# Patient Record
Sex: Female | Born: 1995
Health system: Southern US, Community
[De-identification: ages and names within clinical notes are randomized; demographics above are authoritative.]

## PROBLEM LIST (undated history)

## (undated) DIAGNOSIS — R279 Unspecified lack of coordination: Secondary | ICD-10-CM

## (undated) HISTORY — PX: REFRACTIVE SURGERY: SHX103

## (undated) HISTORY — DX: Unspecified lack of coordination: R27.9

---

## 2014-03-03 ENCOUNTER — Other Ambulatory Visit: Payer: Self-pay | Admitting: Family Medicine

## 2014-03-03 DIAGNOSIS — R109 Unspecified abdominal pain: Secondary | ICD-10-CM

## 2014-03-03 DIAGNOSIS — R102 Pelvic and perineal pain: Secondary | ICD-10-CM

## 2014-03-06 ENCOUNTER — Ambulatory Visit
Admission: RE | Admit: 2014-03-06 | Discharge: 2014-03-06 | Disposition: A | Discharge status: Home / Self Care | Payer: 59 | Source: Ambulatory Visit | Attending: Family Medicine | Admitting: Family Medicine

## 2014-03-06 DIAGNOSIS — R102 Pelvic and perineal pain: Secondary | ICD-10-CM

## 2014-03-06 DIAGNOSIS — R109 Unspecified abdominal pain: Secondary | ICD-10-CM

## 2015-01-12 ENCOUNTER — Other Ambulatory Visit: Payer: Self-pay | Admitting: Occupational Medicine

## 2015-01-12 ENCOUNTER — Ambulatory Visit
Admission: RE | Admit: 2015-01-12 | Discharge: 2015-01-12 | Disposition: A | Discharge status: Home / Self Care | Payer: No Typology Code available for payment source | Source: Ambulatory Visit | Attending: Occupational Medicine | Admitting: Occupational Medicine

## 2015-01-12 DIAGNOSIS — Z021 Encounter for pre-employment examination: Secondary | ICD-10-CM

## 2015-03-01 HISTORY — PX: LASIK: SHX215

## 2015-05-07 ENCOUNTER — Ambulatory Visit
Admission: RE | Admit: 2015-05-07 | Discharge: 2015-05-07 | Disposition: A | Discharge status: Home / Self Care | Payer: Worker's Compensation | Source: Ambulatory Visit | Attending: Nurse Practitioner | Admitting: Nurse Practitioner

## 2015-05-07 ENCOUNTER — Other Ambulatory Visit: Payer: Self-pay | Admitting: Nurse Practitioner

## 2015-05-07 DIAGNOSIS — T1490XA Injury, unspecified, initial encounter: Secondary | ICD-10-CM

## 2015-05-07 DIAGNOSIS — R609 Edema, unspecified: Secondary | ICD-10-CM

## 2015-05-07 DIAGNOSIS — R52 Pain, unspecified: Secondary | ICD-10-CM

## 2018-04-16 ENCOUNTER — Encounter (HOSPITAL_COMMUNITY): Payer: Self-pay | Admitting: Emergency Medicine

## 2018-04-16 ENCOUNTER — Emergency Department (HOSPITAL_COMMUNITY)
Admission: EM | Admit: 2018-04-16 | Discharge: 2018-04-17 | Disposition: A | Discharge status: Home / Self Care | Payer: No Typology Code available for payment source | Attending: Emergency Medicine | Admitting: Emergency Medicine

## 2018-04-16 ENCOUNTER — Other Ambulatory Visit: Payer: Self-pay

## 2018-04-16 DIAGNOSIS — Z7721 Contact with and (suspected) exposure to potentially hazardous body fluids: Secondary | ICD-10-CM | POA: Diagnosis not present

## 2018-04-16 DIAGNOSIS — T148XXA Other injury of unspecified body region, initial encounter: Secondary | ICD-10-CM

## 2018-04-16 DIAGNOSIS — Z23 Encounter for immunization: Secondary | ICD-10-CM | POA: Diagnosis not present

## 2018-04-16 MED ORDER — TETANUS-DIPHTH-ACELL PERTUSSIS 5-2.5-18.5 LF-MCG/0.5 IM SUSP
0.5000 mL | Freq: Once | INTRAMUSCULAR | Status: AC
  Administered 2018-04-17: 0.5 mL via INTRAMUSCULAR
  Filled 2018-04-16: qty 0.5
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
## 2018-04-17 NOTE — Discharge Instructions (Signed)
Please follow up with resources provided on discharge paperwork.   Please return to the emergency department for any new or worsening symptoms.

## 2019-02-25 ENCOUNTER — Emergency Department (INDEPENDENT_AMBULATORY_CARE_PROVIDER_SITE_OTHER)
Admission: EM | Admit: 2019-02-25 | Discharge: 2019-02-25 | Disposition: A | Discharge status: Home / Self Care | Payer: 59 | Source: Home / Self Care | Attending: Family Medicine | Admitting: Family Medicine

## 2019-02-25 ENCOUNTER — Other Ambulatory Visit: Payer: Self-pay

## 2019-02-25 DIAGNOSIS — R2990 Unspecified symptoms and signs involving the nervous system: Secondary | ICD-10-CM

## 2019-02-25 NOTE — ED Triage Notes (Signed)
Pt c/o issues with her balance and coordination (deopth perception) x 3-4 days. Hx of ear infections but not currently experiencing any pain. Also wondering if it could be stress related.

## 2019-02-25 NOTE — Discharge Instructions (Addendum)
Please read attached information about multiple sclerosis. Please monitor your symptoms and record on a calendar.  Take this record to your neurologist.

## 2019-02-25 NOTE — ED Provider Notes (Signed)
[name]    CSN: [id number] Arrival date & time: 02/25/19  1230      History   Chief Complaint Chief Complaint  Patient presents with  . Balance Issues    HPI Julie Wagner is a 23 y.o. female.   Patient complains of 3 to 4 day history of feeling off-balance and decreased coordination.  She denies light-headedness and dizziness.  She has had ear infections in the past but has not had recent earache.  She wonders if her symptoms could be stress related.  The history is provided by the patient.    History reviewed. No pertinent past medical history.  There are no problems to display for this patient.   History reviewed. No pertinent surgical history.  OB History   No obstetric history on file.      Home Medications    Prior to Admission medications   Not on File    Family History History reviewed. No pertinent family history.  Social History Social History   Tobacco Use  . Smoking status: Never Smoker  . Smokeless tobacco: Never Used  Substance Use Topics  . Alcohol use: Never  . Drug use: Never     Allergies   Patient has no known allergies.   Review of Systems Review of Systems  Constitutional: Negative for activity change, appetite change, chills, diaphoresis, fatigue, fever and unexpected weight change.  HENT: Negative.   Eyes: Negative.   Respiratory: Negative.   Cardiovascular: Negative.   Gastrointestinal: Negative.   Endocrine: Negative.   Genitourinary: Negative.   Musculoskeletal: Negative.   Skin: Negative.   Neurological: Negative for dizziness, tremors, seizures, syncope, facial asymmetry, speech difficulty, weakness, light-headedness, numbness and headaches.  Psychiatric/Behavioral: The patient is not nervous/anxious.      Physical Exam Triage Vital Signs ED Triage Vitals [02/25/19 1300]  Enc Vitals Group     BP 137/86     Pulse Rate 89     Resp 18     Temp 98.4 F (36.9 C)     Temp Source Oral   SpO2 99 %     Weight 230 lb (104.3 kg)     Height 5\' 6"  (1.676 m)     Head Circumference      Peak Flow      Pain Score 0     Pain Loc      Pain Edu?      Excl. in [place]?    No data found.  Updated Vital Signs BP 137/86 (BP Location: Right Arm)   Pulse 89   Temp 98.4 F (36.9 C) (Oral)   Resp 18   Ht 5\' 6"  (1.676 m)   Wt 104.3 kg   LMP  (LMP Unknown)   SpO2 99%   BMI 37.12 kg/m   Visual Acuity Right Eye Distance:   Left Eye Distance:   Bilateral Distance:    Right Eye Near:   Left Eye Near:    Bilateral Near:     Physical Exam Nursing notes and Vital Signs reviewed. Appearance:  Patient appears stated age, and in no acute distress Eyes:  Pupils are equal, round, and reactive to light and accomodation.  Extraocular movement is intact.  Conjunctivae are not inflamed  Ears:  Canals normal.  Tympanic membranes normal.  Nose:   Normal turbinates.  No sinus tenderness.   Pharynx:  Normal Neck:  Supple.  No adenopathy or thyromegaly  Lungs:  Clear to auscultation.  Breath sounds are equal.  Moving air well. Heart:  Regular rate and rhythm without murmurs, rubs, or gallops.  Abdomen:  Nontender without masses or hepatosplenomegaly.  Bowel sounds are present.  No CVA or flank tenderness.  Extremities:  No edema.  Skin:  No rash present.  Neurologic:  Cranial nerves 2 through 12 are normal.  Patellar, achilles, and elbow reflexes are normal.  Cerebellar function is intact (finger-to-nose and rapid alternating hand movement).  Gait and station are normal.  Grip strength symmetric bilaterally.  Romberg negative.  UC Treatments / Results  Labs (all labs ordered are listed, but only abnormal results are displayed) Labs Reviewed - No data to display  EKG   Radiology No results found.  Procedures Procedures (including critical care time)  Medications Ordered in UC Medications - No data to display  Initial Impression / Assessment and Plan / UC Course  I have reviewed  the triage vital signs and the nursing notes.  Pertinent labs & imaging results that were available during my care of the patient were reviewed by me and considered in my medical decision making (see chart for details).    Normal neurologic exam reassuring.  ?early multiple sclerosis. Recommend follow-up with a neurologist.   Final Clinical Impressions(s) / UC Diagnoses   Final diagnoses:  Neurological complaint     Discharge Instructions     Please read attached information about multiple sclerosis. Please monitor your symptoms and record on a calendar.  Take this record to your neurologist.    ED Prescriptions    None        Lattie Haw, MD 03/01/19 [phone]

## 2019-02-26 ENCOUNTER — Encounter: Payer: Self-pay | Admitting: Neurology

## 2019-02-26 ENCOUNTER — Ambulatory Visit: Payer: 59 | Admitting: Neurology

## 2019-02-26 VITALS — BP 124/88 | HR 77 | Temp 97.4°F | Ht 66.0 in | Wt 247.0 lb

## 2019-02-26 DIAGNOSIS — R531 Weakness: Secondary | ICD-10-CM | POA: Insufficient documentation

## 2019-02-26 NOTE — Progress Notes (Signed)
PATIENT: Julie Wagner DOB: 03/05/1995  Chief Complaint  Patient presents with  . Coordination Issues    Reports one week ago, she started to feel uncoordinated and off balance.  Gives the example of missing the opening of her pants when stepping into them, not being able to pull her hair back and dropping things.  She feels like she has cotton mouth and her boyfriend told her she has been slurring words at times.  Says her left arm has been noticeably weaker than her right.  She was unable to lift a weight bar at the gym that is normally not a problem for her.   Marland Kitchen PCP    No established PCP.  Referral from Urgent Care.     HISTORICAL  Julie Wagner is a 24 year old female, seen in request by urgent care for evaluation of constellation of complaints, initial evaluation was on February 27, 2019.  I have reviewed and summarized the referring note from the referring physician.  She is a Loss adjuster, chartered, going to gym regularly, around February 23, 2019, she noticed difficulty involving her left arm, when she worked out at [employer], raising up weight, she noticed weakness in her left arm, gradually become more obvious, she drop things from her left arm, when she put her pants on in a standing position, she tends to miss the pant hole on the left side,  She denies numbness tingling, denies gait abnormality,   REVIEW OF SYSTEMS: Full 14 system review of systems performed and notable only for as above All other review of systems were negative.  ALLERGIES: No Known Allergies  HOME MEDICATIONS: No current outpatient medications on file.   No current facility-administered medications for this visit.    PAST MEDICAL HISTORY: Past Medical History:  Diagnosis Date  . Incoordination     PAST SURGICAL HISTORY: Past Surgical History:  Procedure Laterality Date  . REFRACTIVE SURGERY Bilateral     FAMILY HISTORY: Family History  Problem Relation Age of Onset  . Thyroid disease  Mother   . Diabetes Father   . Thyroid disease Brother     SOCIAL HISTORY: Social History   Socioeconomic History  . Marital status: Single    Spouse name: Not on file  . Number of children: 0  . Years of education: college  . Highest education level: Not on file  Occupational History  . Occupation: Engineer, structural  Tobacco Use  . Smoking status: Never Smoker  . Smokeless tobacco: Never Used  Substance and Sexual Activity  . Alcohol use: Never  . Drug use: Never  . Sexual activity: Not on file  Other Topics Concern  . Not on file  Social History Narrative   Lives at home with boyfriend.   Right-handed.   Drinks coffee, with extra espresso, in the mornings.  Occasionally uses caffeinated energy powder.   She works as a Engineer, structural in [place].   Social Determinants of Health   Financial Resource Strain:   . Difficulty of Paying Living Expenses: Not on file  Food Insecurity:   . Worried About Charity fundraiser in the Last Year: Not on file  . Ran Out of Food in the Last Year: Not on file  Transportation Needs:   . Lack of Transportation (Medical): Not on file  . Lack of Transportation (Non-Medical): Not on file  Physical Activity:   . Days of Exercise per Week: Not on file  . Minutes of Exercise per Session: Not  on file  Stress:   . Feeling of Stress : Not on file  Social Connections:   . Frequency of Communication with Friends and Family: Not on file  . Frequency of Social Gatherings with Friends and Family: Not on file  . Attends Religious Services: Not on file  . Active Member of Clubs or Organizations: Not on file  . Attends Banker Meetings: Not on file  . Marital Status: Not on file  Intimate Partner Violence:   . Fear of Current or Ex-Partner: Not on file  . Emotionally Abused: Not on file  . Physically Abused: Not on file  . Sexually Abused: Not on file     PHYSICAL EXAM   Vitals:   02/26/19 1504  BP: 124/88  Pulse: 77  Temp:  (!) 97.4 F (36.3 C)  Weight: 247 lb (112 kg)  Height: 5\' 6"  (1.676 m)    Not recorded      Body mass index is 39.87 kg/m.  PHYSICAL EXAMNIATION:  Gen: NAD, conversant, well nourised, well groomed                     Cardiovascular: Regular rate rhythm, no peripheral edema, warm, nontender. Eyes: Conjunctivae clear without exudates or hemorrhage Neck: Supple, no carotid bruits. Pulmonary: Clear to auscultation bilaterally   NEUROLOGICAL EXAM:  MENTAL STATUS: Speech:    Speech is normal; fluent and spontaneous with normal comprehension.  Cognition:     Orientation to time, place and person     Normal recent and remote memory     Normal Attention span and concentration     Normal Language, naming, repeating,spontaneous speech     Fund of knowledge   CRANIAL NERVES: CN II: Visual fields are full to confrontation. Pupils are round equal and briskly reactive to light. CN III, IV, VI: extraocular movement are normal. No ptosis. CN V: Facial sensation is intact to light touch CN VII: Face is symmetric with normal eye closure  CN VIII: Hearing is normal to causal conversation. CN IX, X: Phonation is normal. CN XI: Head turning and shoulder shrug are intact  MOTOR: She has mild left upper extremity pronation drift, mild left shoulder abduction, external rotation weakness.  REFLEXES: Reflexes are 2+ and symmetric at the biceps, triceps, knees, and ankles. Plantar responses are flexor.  SENSORY: Intact to light touch, pinprick and vibratory sensation are intact in fingers and toes.  COORDINATION: There is no trunk or limb dysmetria noted.  GAIT/STANCE: Posture is normal. Gait is steady with normal steps, base, arm swing, and turning. Heel and toe walking are normal. Tandem gait is normal.  Romberg is absent.   DIAGNOSTIC DATA (LABS, IMAGING, TESTING) - I reviewed patient records, labs, notes, testing and imaging myself where available.   ASSESSMENT AND PLAN  Julie Wagner is a 23 y.o. female   Left arm and subjective left leg weakness  Need to rule out right hemisphere pathology  Proceed with laboratory evaluations  MRI of brain with without contrast.   22, M.D. Ph.D.  [facility] [address], [address] [place], [place] Ph: [phone] Fax: [phone]  CC: Referring Provider

## 2019-02-27 ENCOUNTER — Telehealth: Payer: Self-pay | Admitting: *Deleted

## 2019-02-27 LAB — CBC WITH DIFFERENTIAL/PLATELET
Basophils Absolute: 0.1 10*3/uL (ref 0.0–0.2)
Basos: 0 %
EOS (ABSOLUTE): 0.1 10*3/uL (ref 0.0–0.4)
Eos: 1 %
Hematocrit: 41.9 % (ref 34.0–46.6)
Hemoglobin: 14.3 g/dL (ref 11.1–15.9)
Immature Grans (Abs): 0 10*3/uL (ref 0.0–0.1)
Immature Granulocytes: 0 %
Lymphocytes Absolute: 2.7 10*3/uL (ref 0.7–3.1)
Lymphs: 22 %
MCH: 30.1 pg (ref 26.6–33.0)
MCHC: 34.1 g/dL (ref 31.5–35.7)
MCV: 88 fL (ref 79–97)
Monocytes Absolute: 1.1 10*3/uL — ABNORMAL HIGH (ref 0.1–0.9)
Monocytes: 9 %
Neutrophils Absolute: 8.1 10*3/uL — ABNORMAL HIGH (ref 1.4–7.0)
Neutrophils: 68 %
Platelets: 323 10*3/uL (ref 150–450)
RBC: 4.75 x10E6/uL (ref 3.77–5.28)
RDW: 12 % (ref 11.7–15.4)
WBC: 12.1 10*3/uL — ABNORMAL HIGH (ref 3.4–10.8)

## 2019-02-27 LAB — COMPREHENSIVE METABOLIC PANEL
ALT: 10 IU/L (ref 0–32)
AST: 17 IU/L (ref 0–40)
Albumin/Globulin Ratio: 2 (ref 1.2–2.2)
Albumin: 4.9 g/dL (ref 3.9–5.0)
Alkaline Phosphatase: 75 IU/L (ref 39–117)
BUN/Creatinine Ratio: 16 (ref 9–23)
BUN: 13 mg/dL (ref 6–20)
Bilirubin Total: 0.3 mg/dL (ref 0.0–1.2)
CO2: 23 mmol/L (ref 20–29)
Calcium: 9.3 mg/dL (ref 8.7–10.2)
Chloride: 104 mmol/L (ref 96–106)
Creatinine, Ser: 0.79 mg/dL (ref 0.57–1.00)
GFR calc Af Amer: 120 mL/min/{1.73_m2} (ref >59)
GFR calc non Af Amer: 104 mL/min/{1.73_m2} (ref >59)
Globulin, Total: 2.5 g/dL (ref 1.5–4.5)
Glucose: 90 mg/dL (ref 65–99)
Potassium: 4.6 mmol/L (ref 3.5–5.2)
Sodium: 139 mmol/L (ref 134–144)
Total Protein: 7.4 g/dL (ref 6.0–8.5)

## 2019-02-27 LAB — VITAMIN B12: Vitamin B-12: 827 pg/mL (ref 232–1245)

## 2019-02-27 LAB — TSH: TSH: 0.624 u[IU]/mL (ref 0.450–4.500)

## 2019-02-27 LAB — C-REACTIVE PROTEIN: CRP: 7 mg/L (ref 0–10)

## 2019-02-27 LAB — RPR: RPR Ser Ql: NONREACTIVE

## 2019-02-27 LAB — SEDIMENTATION RATE: Sed Rate: 11 mm/hr (ref 0–32)

## 2019-02-27 LAB — CK: Total CK: 72 U/L (ref 32–182)

## 2019-02-27 NOTE — Telephone Encounter (Signed)
-----   Message from Marcial Pacas, MD sent at 02/27/2019  7:52 AM EST ----- There is mild elevated wbc on lab testing, with elevated neutrophils, ask her if she has any symptoms of infections, such as fever, UTI or URI?

## 2019-02-27 NOTE — Telephone Encounter (Signed)
I spoke to the patient.  She is not feeling bad now but says earlier this month she was bitten by a spider.  She was placed on an antibiotic then developed a yeast infection while taking it.  She was prescribed two medications for the yeast infection and it cleared up.  She understands that she should contact her PCP if any signs or symptoms of infection develop.

## 2019-03-06 ENCOUNTER — Ambulatory Visit: Payer: 59

## 2019-03-06 ENCOUNTER — Other Ambulatory Visit: Payer: Self-pay

## 2019-03-06 DIAGNOSIS — R531 Weakness: Secondary | ICD-10-CM | POA: Diagnosis not present

## 2019-03-06 MED ORDER — GADOBENATE DIMEGLUMINE 529 MG/ML IV SOLN
20.0000 mL | Freq: Once | INTRAVENOUS | Status: AC | PRN
  Administered 2019-03-06: 10:00:00 20 mL via INTRAVENOUS

## 2019-03-07 ENCOUNTER — Encounter: Payer: Self-pay | Admitting: Neurology

## 2019-03-07 ENCOUNTER — Ambulatory Visit: Payer: 59 | Admitting: Neurology

## 2019-03-07 ENCOUNTER — Telehealth: Payer: Self-pay | Admitting: Neurology

## 2019-03-07 ENCOUNTER — Other Ambulatory Visit: Payer: Self-pay

## 2019-03-07 VITALS — BP 123/73 | HR 78 | Temp 97.2°F | Ht 66.0 in | Wt 250.5 lb

## 2019-03-07 DIAGNOSIS — G35 Multiple sclerosis: Secondary | ICD-10-CM | POA: Diagnosis not present

## 2019-03-07 DIAGNOSIS — R531 Weakness: Secondary | ICD-10-CM | POA: Diagnosis not present

## 2019-03-07 MED ORDER — PREDNISONE 10 MG PO TABS: ORAL_TABLET | ORAL | 3 refills | Status: DC

## 2019-03-07 NOTE — Telephone Encounter (Signed)
I have spoken to the patient to let her know Dr. Terrace Arabia would like to discuss her MRI brain results.  She would like to come today.  She will arrive to the office at 3:45pm.

## 2019-03-07 NOTE — Progress Notes (Signed)
PATIENT: Julie Wagner DOB: Jun 07, 1995  Chief Complaint  Patient presents with  . Left-sided weakness    She is here to review the results of her MRI brain.     HISTORICAL  Julie Wagner is a 24 year old female, seen in request by urgent care for evaluation of constellation of complaints, initial evaluation was on February 27, 2019.  I have reviewed and summarized the referring note from the referring physician.  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  There are also 5 other enhancing lesions that is a smaller, and several other T2/flair foci that do not enhance, above findings most consistent with multiple sclerosis.  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, structural  Laboratory evaluation in December 2020 showed normal negative ESR, CMP, CPK, C-reactive protein, TSH, B12, RPR, there is evidence of mild elevated WBC 12.1, with elevated neutrophil 8.1,   REVIEW OF SYSTEMS: Full 14 system review of systems performed and notable only for as above All other review of systems were negative.  ALLERGIES: No Known Allergies  HOME MEDICATIONS: No current outpatient medications on file.   No current facility-administered medications for this visit.    PAST MEDICAL HISTORY: Past Medical History:    Diagnosis Date  . Incoordination     PAST SURGICAL HISTORY: Past Surgical History:  Procedure Laterality Date  . REFRACTIVE SURGERY Bilateral     FAMILY HISTORY: Family History  Problem Relation Age of Onset  . Thyroid disease Mother   . Diabetes Father   . Thyroid disease Brother     SOCIAL HISTORY: Social History   Socioeconomic History  . Marital status: Single    Spouse name: Not on file  . Number of children: 0  . Years of education: college  . Highest education level: Not on file  Occupational History  . Occupation: Engineer, structural  Tobacco Use  . Smoking status: Never Smoker  . Smokeless tobacco: Never Used  Substance and Sexual Activity  . Alcohol use: Never  . Drug use: Never  . Sexual activity: Not on file  Other Topics Concern  . Not on file  Social History Narrative   Lives at home with boyfriend.   Right-handed.   Drinks coffee, with extra espresso, in the mornings.  Occasionally uses caffeinated energy powder.   She works as a Engineer, structural in [place].   Social Determinants of Health   Financial Resource Strain:   . Difficulty of Paying Living Expenses: Not on file  Food Insecurity:   . Worried About Charity fundraiser in the Last Year: Not on file  . Ran Out of Food in the Last Year: Not on file  Transportation Needs:   . Lack of Transportation (Medical): Not on file  . Lack of Transportation (Non-Medical): Not on file  Physical Activity:   .  Days of Exercise per Week: Not on file  . Minutes of Exercise per Session: Not on file  Stress:   . Feeling of Stress : Not on file  Social Connections:   . Frequency of Communication with Friends and Family: Not on file  . Frequency of Social Gatherings with Friends and Family: Not on file  . Attends Religious Services: Not on file  . Active Member of Clubs or Organizations: Not on file  . Attends Archivist Meetings: Not on file  . Marital Status: Not on file  Intimate Partner  Violence:   . Fear of Current or Ex-Partner: Not on file  . Emotionally Abused: Not on file  . Physically Abused: Not on file  . Sexually Abused: Not on file     PHYSICAL EXAM   Vitals:   03/07/19 1532  BP: 123/73  Pulse: 78  Temp: (!) 97.2 F (36.2 C)  Weight: 250 lb 8 oz (113.6 kg)  Height: '5\' 6"'$  (1.676 m)    Not recorded      Body mass index is 40.43 kg/m.  PHYSICAL EXAMNIATION:  Gen: NAD, conversant, well nourised, well groomed                     Cardiovascular: Regular rate rhythm, no peripheral edema, warm, nontender. Eyes: Conjunctivae clear without exudates or hemorrhage Neck: Supple, no carotid bruits. Pulmonary: Clear to auscultation bilaterally   NEUROLOGICAL EXAM:  MENTAL STATUS: Speech:    Speech is normal; fluent and spontaneous with normal comprehension.  Cognition:     Orientation to time, place and person     Normal recent and remote memory     Normal Attention span and concentration     Normal Language, naming, repeating,spontaneous speech     Fund of knowledge   CRANIAL NERVES: CN II: Visual fields are full to confrontation. Pupils are round equal and briskly reactive to light. CN III, IV, VI: extraocular movement are normal. No ptosis. CN V: Facial sensation is intact to light touch CN VII: Face is symmetric with normal eye closure  CN VIII: Hearing is normal to causal conversation. CN IX, X: Phonation is normal. CN XI: Head turning and shoulder shrug are intact  MOTOR: She has mild left upper extremity pronation drift, mild fixation of left upper extremity upon rapid rotating movement  REFLEXES: Reflexes are 2+ and symmetric at the biceps, triceps, knees, and ankles. Plantar responses are flexor.  SENSORY: Intact to light touch, pinprick and vibratory sensation are intact in fingers and toes.  COORDINATION: There is no trunk or limb dysmetria noted.  GAIT/STANCE: Posture is normal. Gait is steady with normal steps, base, arm  swing, and turning. Heel and toe walking are normal. Tandem gait is normal.  Romberg is absent.   DIAGNOSTIC DATA (LABS, IMAGING, TESTING) - I reviewed patient records, labs, notes, testing and imaging myself where available.   ASSESSMENT AND PLAN  Julie Wagner is a 24 y.o. female   Relapsing remitting multiple sclerosis  There is confirmed by abnormal MRI of the brain with without contrast, large 1. 8x1.7, x2.0 cm heterogeneous ring-enhancing lesion at the right frontal lobe, and 5 other enhancing lesions,  Complete evaluation with MRI of cervical, thoracic spine with without contrast  Lumbar puncture, MS panel,  We also went over MS related information, suggested [facility], provide her treatment options, including Tysabri, ocrelizumab, Orlan Leavens, M.D. Ph.D.  [facility] [phone] [address]rd  [address], [place], [place] [address] Ph: [phone] Fax: [phone]  CC: Referring Provider

## 2019-03-07 NOTE — Telephone Encounter (Signed)
IMPRESSION: This MRI of the brain with and without contrast shows the following: 1.    There is a large heterogenous rim-enhancing lesion in the right frontal lobe associated with mass-effect.  There are also 5 other enhancing lesions that are smaller and several other T2/FLAIR hyperintense foci that do not enhance.  The combination of enhancing and nonenhancing lesions is consistent with a diagnosis of multiple sclerosis.  The large focus likely represents a tumefactive lesion while the other enhancing lesions are more typical for MS.   Please call patient, MRI of the brain showed a large right frontal lesions, and other 5 enhancing lesions, above findings consistent with diagnosis of multiple sclerosis,  Please give her a follow-up appointment today, or tomorrow morning to go over her MRIs and treatment plans.

## 2019-03-07 NOTE — Telephone Encounter (Signed)
Left message on patient's voicemail.  Also, left message on her mother's number (on DPR).  Requested a return call.

## 2019-03-07 NOTE — Patient Instructions (Signed)
[email or web address]  IV infusion Lemtrada (alemtuzumab) Ocrevus (ocrelizumab) Tysabri (natalizumab)  Pills Mavenclad (cladribine)

## 2019-03-08 ENCOUNTER — Other Ambulatory Visit: Payer: Self-pay

## 2019-03-08 ENCOUNTER — Telehealth: Payer: Self-pay | Admitting: Neurology

## 2019-03-08 ENCOUNTER — Other Ambulatory Visit (INDEPENDENT_AMBULATORY_CARE_PROVIDER_SITE_OTHER): Payer: Self-pay

## 2019-03-08 DIAGNOSIS — G35 Multiple sclerosis: Secondary | ICD-10-CM

## 2019-03-08 DIAGNOSIS — Z0289 Encounter for other administrative examinations: Secondary | ICD-10-CM

## 2019-03-08 LAB — ANA W/REFLEX: Anti Nuclear Antibody (ANA): NEGATIVE

## 2019-03-08 NOTE — Telephone Encounter (Signed)
I have called Julie Wagner, left message, was able to talk with her mother:  Laboratory evaluation continues to show elevated WBC, neutrophil, indicate potential infection  Mother reported she was treated for UTI a month ago, I ordered repeat UA, with urine culture, chest x-ray,  Advised her to not take prednisone prescription  Vitamin D level was 26, she should take over-the-counter vitamin D3 supplement 1000 units 2 tablets daily

## 2019-03-10 LAB — URINALYSIS, ROUTINE W REFLEX MICROSCOPIC
Bilirubin, UA: NEGATIVE
Glucose, UA: NEGATIVE
Ketones, UA: NEGATIVE
Leukocytes,UA: NEGATIVE
Nitrite, UA: NEGATIVE
Protein,UA: NEGATIVE
RBC, UA: NEGATIVE
Specific Gravity, UA: 1.025 (ref 1.005–1.030)
Urobilinogen, Ur: 0.2 mg/dL (ref 0.2–1.0)
pH, UA: 7 (ref 5.0–7.5)

## 2019-03-10 LAB — URINE CULTURE

## 2019-03-12 LAB — QUANTIFERON-TB GOLD PLUS
QuantiFERON Mitogen Value: >10 IU/mL
QuantiFERON Nil Value: 0.03 IU/mL
QuantiFERON TB1 Ag Value: 0.05 IU/mL
QuantiFERON TB2 Ag Value: 0.04 IU/mL
QuantiFERON-TB Gold Plus: NEGATIVE

## 2019-03-12 LAB — ANGIOTENSIN CONVERTING ENZYME: Angio Convert Enzyme: 29 U/L (ref 14–82)

## 2019-03-12 LAB — CBC WITH DIFFERENTIAL
Basophils Absolute: 0.1 10*3/uL (ref 0.0–0.2)
Basos: 0 %
EOS (ABSOLUTE): 0.1 10*3/uL (ref 0.0–0.4)
Eos: 1 %
Hematocrit: 43.6 % (ref 34.0–46.6)
Hemoglobin: 14.3 g/dL (ref 11.1–15.9)
Immature Grans (Abs): 0 10*3/uL (ref 0.0–0.1)
Immature Granulocytes: 0 %
Lymphocytes Absolute: 1.9 10*3/uL (ref 0.7–3.1)
Lymphs: 14 %
MCH: 30.3 pg (ref 26.6–33.0)
MCHC: 32.8 g/dL (ref 31.5–35.7)
MCV: 92 fL (ref 79–97)
Monocytes Absolute: 1.1 10*3/uL — ABNORMAL HIGH (ref 0.1–0.9)
Monocytes: 8 %
Neutrophils Absolute: 10.6 10*3/uL — ABNORMAL HIGH (ref 1.4–7.0)
Neutrophils: 77 %
RBC: 4.72 x10E6/uL (ref 3.77–5.28)
RDW: 12.1 % (ref 11.7–15.4)
WBC: 13.8 10*3/uL — ABNORMAL HIGH (ref 3.4–10.8)

## 2019-03-12 LAB — HEPATITIS PANEL, ACUTE
Hep A IgM: NEGATIVE
Hep B C IgM: NEGATIVE
Hep C Virus Ab: <0.1 s/co ratio (ref 0.0–0.9)
Hepatitis B Surface Ag: NEGATIVE

## 2019-03-12 LAB — VITAMIN D 25 HYDROXY (VIT D DEFICIENCY, FRACTURES): Vit D, 25-Hydroxy: 26.2 ng/mL — ABNORMAL LOW (ref 30.0–100.0)

## 2019-03-12 LAB — HEPATITIS B CORE ANTIBODY, TOTAL: Hep B Core Total Ab: NEGATIVE

## 2019-03-12 LAB — IMMUNOFIXATION ELECTROPHORESIS
IgA/Immunoglobulin A, Serum: 201 mg/dL (ref 87–352)
IgG (Immunoglobin G), Serum: 894 mg/dL (ref 586–1602)
IgM (Immunoglobulin M), Srm: 95 mg/dL (ref 26–217)
Total Protein: 7.1 g/dL (ref 6.0–8.5)

## 2019-03-12 LAB — VARICELLA ZOSTER ANTIBODY, IGG: Varicella zoster IgG: 1491 index (ref >165)

## 2019-03-12 LAB — B. BURGDORFI ANTIBODIES: Lyme IgG/IgM Ab: <0.91 {ISR} (ref 0.00–0.90)

## 2019-03-12 LAB — COPPER, SERUM: Copper: 130 ug/dL (ref 80–158)

## 2019-03-12 LAB — HIV ANTIBODY (ROUTINE TESTING W REFLEX): HIV Screen 4th Generation wRfx: NONREACTIVE

## 2019-03-12 LAB — FOLATE: Folate: 9 ng/mL (ref >3.0)

## 2019-03-13 ENCOUNTER — Ambulatory Visit
Admission: RE | Admit: 2019-03-13 | Discharge: 2019-03-13 | Disposition: A | Discharge status: Home / Self Care | Payer: 59 | Source: Ambulatory Visit | Attending: Neurology | Admitting: Neurology

## 2019-03-13 ENCOUNTER — Other Ambulatory Visit: Payer: Self-pay

## 2019-03-13 VITALS — BP 145/70 | HR 83

## 2019-03-13 DIAGNOSIS — R531 Weakness: Secondary | ICD-10-CM

## 2019-03-13 DIAGNOSIS — G35 Multiple sclerosis: Secondary | ICD-10-CM

## 2019-03-13 MED ORDER — DIAZEPAM 5 MG PO TABS
5.0000 mg | ORAL_TABLET | Freq: Once | ORAL | Status: AC
Start: 2019-03-13 — End: 2019-03-13
  Administered 2019-03-13: 5 mg via ORAL

## 2019-03-13 NOTE — Progress Notes (Signed)
Pt in nursing station, very tearful and anxious of procedure today. Dr. Karin Golden notified of pts anxiety. Md ordered Valium. See MAR.

## 2019-03-13 NOTE — Discharge Instructions (Signed)

## 2019-03-19 ENCOUNTER — Telehealth: Payer: Self-pay | Admitting: Neurology

## 2019-03-19 NOTE — Telephone Encounter (Signed)
I returned the call to the patient. She has been feeling anxious and frequently vomiting since her recent diagnosis of MS. She is going to contact her PCP to schedule an appt to address these symptoms. Also, to rule out any other causes for the vomiting. She has a pending appt with Dr. Terrace Arabia on 03/28/2019 and I encouraged her to write her questions down so they can all be answered. She was in agreement with this plan.

## 2019-03-19 NOTE — Telephone Encounter (Signed)
Patient called in and stated she has been vomiting a lot and she wants to know what can be done because she doesn't want to keep vomiting every day and all day

## 2019-03-25 ENCOUNTER — Telehealth: Payer: Self-pay | Admitting: Neurology

## 2019-03-25 NOTE — Telephone Encounter (Signed)
I returned the call to the patient and she verbalized understanding. She will keep her pending appt on 03/28/2019.

## 2019-03-25 NOTE — Telephone Encounter (Signed)
The patient is aware. She has a pending appt on 03/28/2019 to discuss her diagnosis and treatment options.

## 2019-03-25 NOTE — Telephone Encounter (Signed)
Pt was informed. Pt is wanting to speak to RN. Please advise.

## 2019-03-25 NOTE — Telephone Encounter (Signed)
Please call patient, spinal fluid testing also supported diagnosis of multiple sclerosis,

## 2019-03-27 ENCOUNTER — Ambulatory Visit: Payer: Self-pay | Admitting: Neurology

## 2019-03-28 ENCOUNTER — Other Ambulatory Visit: Payer: Self-pay

## 2019-03-28 ENCOUNTER — Telehealth: Payer: Self-pay | Admitting: *Deleted

## 2019-03-28 ENCOUNTER — Ambulatory Visit: Payer: 59 | Admitting: Neurology

## 2019-03-28 ENCOUNTER — Ambulatory Visit
Admission: RE | Admit: 2019-03-28 | Discharge: 2019-03-28 | Disposition: A | Discharge status: Home / Self Care | Payer: 59 | Source: Ambulatory Visit | Attending: Neurology | Admitting: Neurology

## 2019-03-28 ENCOUNTER — Encounter: Payer: Self-pay | Admitting: *Deleted

## 2019-03-28 ENCOUNTER — Telehealth: Payer: Self-pay | Admitting: Neurology

## 2019-03-28 ENCOUNTER — Encounter: Payer: Self-pay | Admitting: Neurology

## 2019-03-28 VITALS — BP 142/89 | HR 79 | Temp 97.3°F | Ht 66.0 in | Wt 247.0 lb

## 2019-03-28 DIAGNOSIS — G35 Multiple sclerosis: Secondary | ICD-10-CM

## 2019-03-28 DIAGNOSIS — F419 Anxiety disorder, unspecified: Secondary | ICD-10-CM

## 2019-03-28 MED ORDER — ONDANSETRON 4 MG PO TBDP: 4.0000 mg | ORAL_TABLET | Freq: Every day | ORAL | 6 refills | Status: DC | PRN

## 2019-03-28 MED ORDER — SERTRALINE HCL 50 MG PO TABS: 100.0000 mg | ORAL_TABLET | Freq: Every day | ORAL | 11 refills | Status: DC

## 2019-03-28 NOTE — Telephone Encounter (Signed)
JCV collected on 03/07/2019:  JCV 1.35 (H)

## 2019-03-28 NOTE — Progress Notes (Signed)
PATIENT: Julie Wagner DOB: 10/25/1995  Chief Complaint  Patient presents with  . Multiple Sclerosis    She is here with her significant other. She was provided with a Prednisone rx but never took it. She is wondering if she needs to start the medication. She would like to review her test results and discuss treatment for her diagnosis.  Her JCV collected on 03/07/2019 came back high at 1.35.     HISTORICAL  Julie Wagner is a 24 year old female, seen in request by urgent care for evaluation of constellation of complaints, initial evaluation was on February 27, 2019.  I have reviewed and summarized the referring note from the referring physician.  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  There are also 5 other enhancing lesions that is a smaller, and several other T2/flair foci that do not enhance, above findings most consistent with multiple sclerosis.  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, structural  Laboratory evaluation in December 2020 showed normal negative ESR, CMP, CPK, C-reactive protein, TSH, B12, RPR, there is evidence of mild elevated WBC 12.1, with elevated neutrophil 8.1,  Update March 28, 2019: She is accompanied by her significant others at today's clinical visit, we again reviewed MRI  of the brain with without contrast Spinal fluid testing on March 13, 2019 showed more than 5 oligoclonal band, elevated IgG synthetic rate, elevated IgG index, mildly low vitamin D 26,  Negative QuantiFERON-TB, positive VZV IgG titer, negative ANA, hepatitis C, Titus B core antibody, hepatitis B surface antigen,  Left-sided weakness has mostly recovered, only have residual right arm weakness,  She complains of anxiety, frequent nausea, difficulty sleeping since diagnosis of multiple sclerosis,  REVIEW OF SYSTEMS: Full 14 system review of systems performed and notable only for as above All other review of systems were negative.  ALLERGIES: No Known Allergies  HOME MEDICATIONS: Current Outpatient Medications  Medication Sig Dispense Refill  . predniSONE (DELTASONE) 10 MG tablet 6 tablets for 3 days 5 tablets for 3 days 4 tablets for 3 days 3 tablets for 3 days 2 tablets for 3 days 1 tablet for 3 days 90 tablet 3   No current facility-administered medications for this visit.    PAST MEDICAL HISTORY: Past Medical History:  Diagnosis Date  . Incoordination     PAST SURGICAL HISTORY: Past Surgical History:  Procedure Laterality Date  . REFRACTIVE SURGERY Bilateral     FAMILY HISTORY: Family History  Problem Relation Age of Onset  . Thyroid disease Mother   . Diabetes Father   . Thyroid disease Brother     SOCIAL HISTORY: Social History   Socioeconomic History  . Marital status: Single    Spouse name: Not on file  . Number of children: 0  . Years of education: college  . Highest  education level: Not on file  Occupational History  . Occupation: Engineer, structural  Tobacco Use  . Smoking status: Never Smoker  . Smokeless tobacco: Never Used  Substance and Sexual Activity  . Alcohol use: Never  . Drug use: Never  . Sexual activity: Not on file  Other Topics Concern  . Not on file  Social History Narrative   Lives at home with boyfriend.   Right-handed.    Drinks coffee, with extra espresso, in the mornings.  Occasionally uses caffeinated energy powder.   She works as a Engineer, structural in [place].   Social Determinants of Health   Financial Resource Strain:   . Difficulty of Paying Living Expenses: Not on file  Food Insecurity:   . Worried About Charity fundraiser in the Last Year: Not on file  . Ran Out of Food in the Last Year: Not on file  Transportation Needs:   . Lack of Transportation (Medical): Not on file  . Lack of Transportation (Non-Medical): Not on file  Physical Activity:   . Days of Exercise per Week: Not on file  . Minutes of Exercise per Session: Not on file  Stress:   . Feeling of Stress : Not on file  Social Connections:   . Frequency of Communication with Friends and Family: Not on file  . Frequency of Social Gatherings with Friends and Family: Not on file  . Attends Religious Services: Not on file  . Active Member of Clubs or Organizations: Not on file  . Attends Archivist Meetings: Not on file  . Marital Status: Not on file  Intimate Partner Violence:   . Fear of Current or Ex-Partner: Not on file  . Emotionally Abused: Not on file  . Physically Abused: Not on file  . Sexually Abused: Not on file     PHYSICAL EXAM   Vitals:   03/28/19 0916  BP: (!) 142/89  Pulse: 79  Temp: (!) 97.3 F (36.3 C)  Weight: 247 lb (112 kg)  Height: 5' 6"  (1.676 m)    Not recorded      Body mass index is 39.87 kg/m.  PHYSICAL EXAMNIATION:  Gen: NAD, conversant, well nourised, well groomed                     Cardiovascular: Regular rate rhythm, no peripheral edema, warm, nontender. Eyes: Conjunctivae clear without exudates or hemorrhage Neck: Wagner, no carotid bruits. Pulmonary: Clear to auscultation bilaterally   NEUROLOGICAL EXAM:  MENTAL STATUS: Speech:    Speech is normal; fluent and spontaneous with normal comprehension.  Cognition:     Orientation to time, place and person     Normal  recent and remote memory     Normal Attention span and concentration     Normal Language, naming, repeating,spontaneous speech     Fund of knowledge   CRANIAL NERVES: CN II: Visual fields are full to confrontation. Pupils are round equal and briskly reactive to light. CN III, IV, VI: extraocular movement are normal. No ptosis. CN V: Facial sensation is intact to light touch CN VII: Face is symmetric with normal eye closure  CN VIII: Hearing is normal to causal conversation. CN IX, X: Phonation is normal. CN XI: Head turning and shoulder shrug are intact  MOTOR: She has mild fixation of left upper extremity upon rapid rotating movement  REFLEXES: Reflexes are 2+ and symmetric at the biceps, triceps, knees, and ankles. Plantar responses are flexor.  SENSORY: Intact  to light touch, pinprick and vibratory sensation are intact in fingers and toes.  COORDINATION: There is no trunk or limb dysmetria noted.  GAIT/STANCE: Posture is normal. Gait is steady with normal steps, base, arm swing, and turning. Heel and toe walking are normal. Tandem gait is normal.  Romberg is absent.   DIAGNOSTIC DATA (LABS, IMAGING, TESTING) - I reviewed patient records, labs, notes, testing and imaging myself where available.   ASSESSMENT AND PLAN  Julie Wagner is a 24 y.o. female   Relapsing remitting multiple sclerosis  There is confirmed by abnormal MRI of the brain with without contrast, large 1. 8x1.7, x2.0 cm heterogeneous ring-enhancing lesion at the right frontal lobe, and 5 other enhancing lesions,  CSF showed more than 5 oligoclonal banding, elevated IgG synthesis rate, IgG index  Complete evaluation with MRI of cervical, thoracic spine with without contrast  We had extensive discussion about treatment options, decided to proceed with [facility], she understands the potential side effect,  Anxiety  Zoloft 50 mg titrating to 100 mg,  Zofran as needed for nausea  Elevated CBC  Repeat  laboratory evaluations  UA chest x-ray to rule out infection  Marcial Pacas, M.D. Ph.D.  [facility] [address], [place], [place] [address] Ph: [phone] Fax: [phone]  CC: Referring Provider

## 2019-03-28 NOTE — Telephone Encounter (Signed)
PA for Mavenclad started on covermymeds ([id number]). Pt has coverage with [employer] [phone]). Pt TG#[id number]. LG#[id number]. Decision pending.

## 2019-03-28 NOTE — Telephone Encounter (Signed)
I have ordered MRI cervical, thoracic w/wo, but she has not heard anything from it.

## 2019-03-29 LAB — PREGNANCY, URINE: Preg Test, Ur: NEGATIVE

## 2019-03-29 LAB — CBC WITH DIFFERENTIAL/PLATELET
Basophils Absolute: 0.1 10*3/uL (ref 0.0–0.2)
Basos: 1 %
EOS (ABSOLUTE): 0.2 10*3/uL (ref 0.0–0.4)
Eos: 2 %
Hematocrit: 41.1 % (ref 34.0–46.6)
Hemoglobin: 13.6 g/dL (ref 11.1–15.9)
Immature Grans (Abs): 0 10*3/uL (ref 0.0–0.1)
Immature Granulocytes: 0 %
Lymphocytes Absolute: 2.5 10*3/uL (ref 0.7–3.1)
Lymphs: 29 %
MCH: 30.6 pg (ref 26.6–33.0)
MCHC: 33.1 g/dL (ref 31.5–35.7)
MCV: 93 fL (ref 79–97)
Monocytes Absolute: 1 10*3/uL — ABNORMAL HIGH (ref 0.1–0.9)
Monocytes: 11 %
Neutrophils Absolute: 5.1 10*3/uL (ref 1.4–7.0)
Neutrophils: 57 %
Platelets: 300 10*3/uL (ref 150–450)
RBC: 4.44 x10E6/uL (ref 3.77–5.28)
RDW: 11.9 % (ref 11.7–15.4)
WBC: 8.8 10*3/uL (ref 3.4–10.8)

## 2019-03-29 LAB — URINALYSIS, ROUTINE W REFLEX MICROSCOPIC
Bilirubin, UA: NEGATIVE
Glucose, UA: NEGATIVE
Ketones, UA: NEGATIVE
Leukocytes,UA: NEGATIVE
Nitrite, UA: NEGATIVE
Protein,UA: NEGATIVE
RBC, UA: NEGATIVE
Specific Gravity, UA: 1.019 (ref 1.005–1.030)
Urobilinogen, Ur: 0.2 mg/dL (ref 0.2–1.0)
pH, UA: 7.5 (ref 5.0–7.5)

## 2019-03-29 NOTE — Telephone Encounter (Signed)
PA approved through 03/27/2020.

## 2019-04-01 NOTE — Telephone Encounter (Signed)
I called the patient and offered to give her the number to GI, she said she would do it herself. DWD

## 2019-04-03 ENCOUNTER — Telehealth: Payer: Self-pay | Admitting: *Deleted

## 2019-04-03 NOTE — Telephone Encounter (Signed)
Received status update from [facility] [phone]) with the following information:  The patient has scheduled delivery of Mavenclad and should receive the shipment on 04/04/2019.

## 2019-04-11 LAB — FUNGUS CULTURE W SMEAR
CULTURE:: NO GROWTH
MICRO NUMBER:: 10037500
SMEAR:: NONE SEEN
SPECIMEN QUALITY:: ADEQUATE

## 2019-04-11 LAB — CSF CELL COUNT WITH DIFFERENTIAL
RBC Count, CSF: 0 cells/uL
WBC, CSF: 3 cells/uL (ref 0–5)

## 2019-04-11 LAB — MULTIPLE SCLEROSIS PANEL 2
Albumin Serum: 4.8 g/dL (ref 3.5–5.2)
Albumin, CSF: 20.3 mg/dL (ref 8.0–42.0)
CNS-IgG Synthesis Rate: 14.1 mg/24 h — ABNORMAL HIGH (ref <=3.3)
IgG (Immunoglobin G), Serum: 932 mg/dL (ref 600–1640)
IgG Total CSF: 5.3 mg/dL (ref 0.8–7.7)
IgG-Index: 1.34 — ABNORMAL HIGH (ref <0.66)
Myelin Basic Protein: <2 mcg/L (ref 2.0–4.0)

## 2019-04-11 LAB — PROTEIN, CSF: Total Protein, CSF: 33 mg/dL (ref 15–45)

## 2019-04-11 LAB — GLUCOSE, CSF: Glucose, CSF: 58 mg/dL (ref 40–80)

## 2019-04-11 LAB — GRAM STAIN
GRAM STAIN:: NONE SEEN
MICRO NUMBER:: 10037499
SPECIMEN QUALITY:: ADEQUATE

## 2019-04-11 LAB — VDRL, CSF: VDRL Quant, CSF: NONREACTIVE

## 2019-04-12 ENCOUNTER — Ambulatory Visit
Admission: RE | Admit: 2019-04-12 | Discharge: 2019-04-12 | Disposition: A | Discharge status: Home / Self Care | Payer: 59 | Source: Ambulatory Visit | Attending: Neurology | Admitting: Neurology

## 2019-04-12 ENCOUNTER — Other Ambulatory Visit: Payer: Self-pay

## 2019-04-12 DIAGNOSIS — G35 Multiple sclerosis: Secondary | ICD-10-CM

## 2019-04-12 MED ORDER — GADOBENATE DIMEGLUMINE 529 MG/ML IV SOLN: 20.0000 mL | Freq: Once | INTRAVENOUS | Status: DC | PRN

## 2019-04-12 MED ORDER — GADOBENATE DIMEGLUMINE 529 MG/ML IV SOLN
20.0000 mL | Freq: Once | INTRAVENOUS | Status: AC | PRN
  Administered 2019-04-12: 14:00:00 20 mL via INTRAVENOUS

## 2019-07-09 ENCOUNTER — Encounter: Payer: Self-pay | Admitting: Neurology

## 2019-07-09 ENCOUNTER — Other Ambulatory Visit: Payer: Self-pay

## 2019-07-09 ENCOUNTER — Ambulatory Visit: Payer: 59 | Admitting: Neurology

## 2019-07-09 VITALS — BP 117/77 | HR 72 | Ht 66.5 in | Wt 249.0 lb

## 2019-07-09 DIAGNOSIS — IMO0002 Reserved for concepts with insufficient information to code with codable children: Secondary | ICD-10-CM

## 2019-07-09 DIAGNOSIS — G43709 Chronic migraine without aura, not intractable, without status migrainosus: Secondary | ICD-10-CM | POA: Diagnosis not present

## 2019-07-09 DIAGNOSIS — G35 Multiple sclerosis: Secondary | ICD-10-CM | POA: Diagnosis not present

## 2019-07-09 DIAGNOSIS — F419 Anxiety disorder, unspecified: Secondary | ICD-10-CM | POA: Diagnosis not present

## 2019-07-09 MED ORDER — SUMATRIPTAN SUCCINATE 50 MG PO TABS: 50.0000 mg | ORAL_TABLET | ORAL | 6 refills | Status: DC | PRN

## 2019-07-09 MED ORDER — SERTRALINE HCL 50 MG PO TABS: 100.0000 mg | ORAL_TABLET | Freq: Every day | ORAL | 4 refills | Status: DC

## 2019-07-09 NOTE — Progress Notes (Signed)
PATIENT: Julie Wagner DOB: August 19, 1995  Chief Complaint  Patient presents with  . Multiple Sclerosis    New room, alone. No longer on mavenclad. Denies new symptoms.     HISTORICAL  Julie Wagner is a 24 year old female, seen in request by urgent care for evaluation of constellation of complaints, initial evaluation was on February 27, 2019.  I have reviewed and summarized the referring note from the referring physician.  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  There are also 5 other enhancing lesions that is a smaller, and several other T2/flair foci that do not enhance, above findings most consistent with multiple sclerosis.  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, structural  Laboratory evaluation in December 2020 showed normal negative ESR, CMP, CPK, C-reactive protein, TSH, B12, RPR, there is evidence of mild elevated WBC 12.1, with elevated neutrophil 8.1,  Update March 28, 2019: She is accompanied by her significant others at today's clinical visit, we again reviewed MRI of the brain with without contrast Spinal fluid testing on March 13, 2019 showed more than 5 oligoclonal band, elevated IgG synthetic rate, elevated IgG index, mildly low vitamin D 26,  Negative QuantiFERON-TB, positive VZV  IgG titer, negative ANA, hepatitis C, Titus B core antibody, hepatitis B surface antigen,  Left-sided weakness has mostly recovered, only have residual right arm weakness,  She complains of anxiety, frequent nausea, difficulty sleeping since diagnosis of multiple sclerosis,  UPDATE Jul 09 2019: She finished her Smiley treatment in February, and the second dose 4 weeks later, she complains of nausea, headache during treatment, but much improved afterwards  She had a couple years history of intermittent headaches, retro-orbital area severe pounding headache with associated light, smell sensitivity, movement made it worse, sleep helps, she has tried over-the-counter medication with limited help, is usually last half to 1 day  Her anxiety is under much better control taking Zoloft 50 mg 2 tablets every day, she works shift work as a Engineer, structural, 4 PM to 3 AM, 4 days on, 4 days of, complains of difficulty sleeping, taking Benadryl as needed, denies significant fatigue, continue has mild left arm weakness, was not able to go back to her workout routine, mild left leg difficulty contributed to her recent left ankle injury  I again personally reviewed MRI of the brain with without contrast in January 2021, large heterogeneously ring-enhancing lesion in the right frontal, with associated mass-effect, 5 other enhancing lesions that are smaller, that does not enhance  MRI of thoracic, and cervical spine showed no evidence of intrinsic lesions  Repeat CBC prior to treatment showed normal WBC, absolute lymphocyte 2.5,  REVIEW OF SYSTEMS: Full 14 system review of systems performed and notable only for as above All other review of systems were negative.  ALLERGIES: No Known Allergies  HOME MEDICATIONS: Current Outpatient Medications  Medication Sig Dispense Refill  . sertraline (ZOLOFT) 50 MG tablet Take 2 tablets (100 mg total) by mouth daily. 60 tablet 11   No current facility-administered  medications for this visit.    PAST MEDICAL HISTORY: Past Medical History:  Diagnosis Date  . Incoordination     PAST SURGICAL HISTORY: Past Surgical History:  Procedure Laterality Date  . REFRACTIVE SURGERY Bilateral     FAMILY HISTORY: Family History  Problem Relation Age of Onset  . Thyroid disease Mother   . Diabetes Father   . Thyroid disease Brother     SOCIAL HISTORY: Social History   Socioeconomic History  . Marital status: Single    Spouse name: Not on file  . Number of children: 0  . Years of education: college  . Highest education level: Not on file  Occupational History  . Occupation: Engineer, structural  Tobacco Use  . Smoking status: Never Smoker  . Smokeless tobacco: Never Used  Substance and Sexual Activity  . Alcohol use: Never  . Drug use: Never  . Sexual activity: Not on file  Other Topics Concern  . Not on file  Social History Narrative   Lives at home with boyfriend.   Right-handed.   Drinks coffee, with extra espresso, in the mornings.  Occasionally uses caffeinated energy powder.   She works as a Engineer, structural in [place].   Social Determinants of Health   Financial Resource Strain:   . Difficulty of Paying Living Expenses:   Food Insecurity:   . Worried About Charity fundraiser in the Last Year:   . Arboriculturist in the Last Year:   Transportation Needs:   . Film/video editor (Medical):   Marland Kitchen Lack of Transportation (Non-Medical):   Physical Activity:   . Days of Exercise per Week:   . Minutes of Exercise per Session:   Stress:   . Feeling of Stress :   Social Connections:   . Frequency of Communication with Friends and Family:   . Frequency of Social Gatherings with Friends and Family:   . Attends Religious Services:   . Active Member of Clubs or Organizations:   . Attends Archivist Meetings:   Marland Kitchen Marital Status:   Intimate Partner Violence:   . Fear of Current or Ex-Partner:   . Emotionally Abused:   Marland Kitchen  Physically Abused:   . Sexually Abused:      PHYSICAL EXAM   Vitals:   07/09/19 1056  BP: 117/77  Pulse: 72  Weight: 249 lb (112.9 kg)  Height: 5' 6.5" (1.689 m)    Not recorded      Body mass index is 39.59 kg/m.  PHYSICAL EXAMNIATION:  Gen: NAD, conversant, well nourised, well groomed                     Cardiovascular: Regular rate rhythm, no peripheral edema, warm, nontender. Eyes: Conjunctivae clear without exudates or hemorrhage Neck: Supple, no carotid bruits. Pulmonary: Clear to auscultation bilaterally   NEUROLOGICAL EXAM:  MENTAL STATUS: Speech:    Speech is normal; fluent and spontaneous with normal comprehension.  Cognition:     Orientation to time, place and person     Normal recent and remote memory     Normal Attention span and concentration     Normal Language, naming, repeating,spontaneous speech     Fund of knowledge   CRANIAL NERVES: CN II: Visual fields are full  to confrontation. Pupils are round equal and briskly reactive to light. CN III, IV, VI: extraocular movement are normal. No ptosis. CN V: Facial sensation is intact to light touch CN VII: Face is symmetric with normal eye closure  CN VIII: Hearing is normal to causal conversation. CN IX, X: Phonation is normal. CN XI: Head turning and shoulder shrug are intact  MOTOR: She has mild fixation of left upper extremity upon rapid rotating movement  REFLEXES: Reflexes are 2+ and symmetric at the biceps, triceps, knees, and ankles. Plantar responses are flexor.  SENSORY: Intact to light touch, pinprick and vibratory sensation are intact in fingers and toes.  COORDINATION: There is no trunk or limb dysmetria noted.  GAIT/STANCE: Posture is normal. Gait is steady with normal steps, base, arm swing, and turning. Heel and toe walking are normal. Tandem gait is normal.  Romberg is absent.   DIAGNOSTIC DATA (LABS, IMAGING, TESTING) - I reviewed patient records, labs, notes, testing and  imaging myself where available.   ASSESSMENT AND PLAN  Julie Wagner is a 24 y.o. female   Relapsing remitting multiple sclerosis  There is confirmed by abnormal MRI of the brain with without contrast, large 1. 8x1.7, x2.0 cm heterogeneous ring-enhancing lesion at the right frontal lobe, and 5 other enhancing lesions,  CSF showed more than 5 oligoclonal banding, elevated IgG synthesis rate, IgG index  There was no involvement of spinal cord based on MRI of cervical, thoracic spine with without contrast, and history  She completed her first year of [place] treatment in February, overall tolerating the medication well,  Repeat laboratory evaluation including CMP, CBC with differentiation, TSH  Repeat MRI of the brain with without contrast  Anxiety  Improved with Zoloft 100 mg daily  Benadryl, melatonin as needed for sleep  Headache,  Has migraine features,  Not responding well to over-the-counter medication, Imitrex 50 mg as needed  Marcial Pacas, M.D. Ph.D.  [facility] [address], [place], [place] [address] Ph: [phone] Fax: [phone]  CC: Referring Provider

## 2019-07-10 ENCOUNTER — Other Ambulatory Visit: Payer: Self-pay

## 2019-07-10 ENCOUNTER — Telehealth: Payer: Self-pay | Admitting: Neurology

## 2019-07-10 ENCOUNTER — Other Ambulatory Visit (INDEPENDENT_AMBULATORY_CARE_PROVIDER_SITE_OTHER): Payer: Self-pay

## 2019-07-10 DIAGNOSIS — G35 Multiple sclerosis: Secondary | ICD-10-CM

## 2019-07-10 DIAGNOSIS — Z0289 Encounter for other administrative examinations: Secondary | ICD-10-CM

## 2019-07-10 LAB — CBC WITH DIFFERENTIAL
Basophils Absolute: 0.1 10*3/uL (ref 0.0–0.2)
Basos: 1 %
EOS (ABSOLUTE): 0.3 10*3/uL (ref 0.0–0.4)
Eos: 4 %
Hematocrit: 42.2 % (ref 34.0–46.6)
Hemoglobin: 14.1 g/dL (ref 11.1–15.9)
Immature Grans (Abs): 0 10*3/uL (ref 0.0–0.1)
Immature Granulocytes: 0 %
Lymphocytes Absolute: 0.9 10*3/uL (ref 0.7–3.1)
Lymphs: 10 %
MCH: 31.2 pg (ref 26.6–33.0)
MCHC: 33.4 g/dL (ref 31.5–35.7)
MCV: 93 fL (ref 79–97)
Monocytes Absolute: 1 10*3/uL — ABNORMAL HIGH (ref 0.1–0.9)
Monocytes: 11 %
Neutrophils Absolute: 6.8 10*3/uL (ref 1.4–7.0)
Neutrophils: 74 %
RBC: 4.52 x10E6/uL (ref 3.77–5.28)
RDW: 12.5 % (ref 11.7–15.4)
WBC: 9.1 10*3/uL (ref 3.4–10.8)

## 2019-07-10 LAB — TSH: TSH: 0.377 u[IU]/mL — ABNORMAL LOW (ref 0.450–4.500)

## 2019-07-10 LAB — COMPREHENSIVE METABOLIC PANEL
ALT: 10 IU/L (ref 0–32)
AST: 18 IU/L (ref 0–40)
Albumin/Globulin Ratio: 1.6 (ref 1.2–2.2)
Albumin: 4.3 g/dL (ref 3.9–5.0)
Alkaline Phosphatase: 76 IU/L (ref 39–117)
BUN/Creatinine Ratio: 11 (ref 9–23)
BUN: 8 mg/dL (ref 6–20)
Bilirubin Total: 0.3 mg/dL (ref 0.0–1.2)
CO2: 23 mmol/L (ref 20–29)
Calcium: 8.9 mg/dL (ref 8.7–10.2)
Chloride: 104 mmol/L (ref 96–106)
Creatinine, Ser: 0.72 mg/dL (ref 0.57–1.00)
GFR calc Af Amer: 135 mL/min/{1.73_m2} (ref >59)
GFR calc non Af Amer: 117 mL/min/{1.73_m2} (ref >59)
Globulin, Total: 2.7 g/dL (ref 1.5–4.5)
Glucose: 90 mg/dL (ref 65–99)
Potassium: 3.9 mmol/L (ref 3.5–5.2)
Sodium: 139 mmol/L (ref 134–144)
Total Protein: 7 g/dL (ref 6.0–8.5)

## 2019-07-10 NOTE — Telephone Encounter (Signed)
Please call patient, laboratory evaluation showed mildly decreased TSH, this could indicate elevated thyroid function, I will repeat thyroid functional tests, lab order was placed, she can come in for repeat laboratory evaluation without appointment  CBC also showed decreased lymphocyte compared to her baseline, which is expected for patient receiving Mavenclad treatment, it is 900xE3/UL, which is still within normal limit.

## 2019-07-10 NOTE — Telephone Encounter (Signed)
I called the pt and we were able to review her labs. Pt verbalized understanding and will plan to come back for repeat labs this afternoon.

## 2019-07-11 ENCOUNTER — Telehealth: Payer: Self-pay | Admitting: Neurology

## 2019-07-11 DIAGNOSIS — R7989 Other specified abnormal findings of blood chemistry: Secondary | ICD-10-CM

## 2019-07-11 LAB — THYROID PANEL WITH TSH
Free Thyroxine Index: 1.7 (ref 1.2–4.9)
T3 Uptake Ratio: 22 % — ABNORMAL LOW (ref 24–39)
T4, Total: 7.6 ug/dL (ref 4.5–12.0)
TSH: 0.356 u[IU]/mL — ABNORMAL LOW (ref 0.450–4.500)

## 2019-07-11 NOTE — Telephone Encounter (Signed)
I referred her to [facility]  endocrinologsit

## 2019-07-11 NOTE — Telephone Encounter (Signed)
I reached out to the pt and advised of lab results. She verbalized understanding and was agreeable to referral to the endocrinologist.. She is not established with a PCP at this time.

## 2019-07-11 NOTE — Telephone Encounter (Signed)
Please call patient, repeat thyroid functional test was still mildly abnormal, decrease the TSH, with decrease the T3 uptake ratios, free thyroxine index,  I did not find her primary care information in chart,  She needs to follow-up with her primary care physician, if she prefer, I may refer her to an endocrinologist,

## 2019-07-11 NOTE — Addendum Note (Signed)
Addended by: Levert Feinstein on: 07/11/2019 05:12 PM   Modules accepted: Orders

## 2019-08-28 ENCOUNTER — Other Ambulatory Visit: Payer: Self-pay

## 2019-08-28 ENCOUNTER — Ambulatory Visit (INDEPENDENT_AMBULATORY_CARE_PROVIDER_SITE_OTHER): Payer: 59 | Admitting: Endocrinology

## 2019-08-28 ENCOUNTER — Encounter: Payer: Self-pay | Admitting: Endocrinology

## 2019-08-28 DIAGNOSIS — E0590 Thyrotoxicosis, unspecified without thyrotoxic crisis or storm: Secondary | ICD-10-CM | POA: Insufficient documentation

## 2019-08-28 NOTE — Progress Notes (Addendum)
Subjective:    Patient ID: Julie Wagner, female    DOB: Feb 26, 1996, 26 y.o.   MRN: [id number]  HPI Pt is referred by Dr. Terrace Arabia, for hyperthyroidism.  Pt reports he was dx'ed with hyperthyroidism 6 weeks ago.  she has never been on therapy for this.  she has never had XRT to the anterior neck, or thyroid surgery.  she has never had thyroid imaging.  she does not consume kelp or any other non-prescribed thyroid medication.  she has never been on amiodarone.  Main symptom is weight gain.   Past Medical History:  Diagnosis Date  . Incoordination     Past Surgical History:  Procedure Laterality Date  . REFRACTIVE SURGERY Bilateral     Social History   Socioeconomic History  . Marital status: Single    Spouse name: Not on file  . Number of children: 0  . Years of education: college  . Highest education level: Not on file  Occupational History  . Occupation: Emergency planning/management officer  Tobacco Use  . Smoking status: Never Smoker  . Smokeless tobacco: Never Used  Vaping Use  . Vaping Use: Never used  Substance and Sexual Activity  . Alcohol use: Never  . Drug use: Never  . Sexual activity: Not on file  Other Topics Concern  . Not on file  Social History Narrative   Lives at home with boyfriend.   Right-handed.   Drinks coffee, with extra espresso, in the mornings.  Occasionally uses caffeinated energy powder.   She works as a Emergency planning/management officer in [place].   Social Determinants of Health   Financial Resource Strain:   . Difficulty of Paying Living Expenses:   Food Insecurity:   . Worried About Programme researcher, broadcasting/film/video in the Last Year:   . Barista in the Last Year:   Transportation Needs:   . Freight forwarder (Medical):   Marland Kitchen Lack of Transportation (Non-Medical):   Physical Activity:   . Days of Exercise per Week:   . Minutes of Exercise per Session:   Stress:   . Feeling of Stress :   Social Connections:   . Frequency of Communication with Friends and Family:   .  Frequency of Social Gatherings with Friends and Family:   . Attends Religious Services:   . Active Member of Clubs or Organizations:   . Attends Banker Meetings:   Marland Kitchen Marital Status:   Intimate Partner Violence:   . Fear of Current or Ex-Partner:   . Emotionally Abused:   Marland Kitchen Physically Abused:   . Sexually Abused:     Current Outpatient Medications on File Prior to Visit  Medication Sig Dispense Refill  . cetirizine (ZYRTEC) 10 MG tablet Take 10 mg by mouth as needed for allergies.    Marland Kitchen ondansetron (ZOFRAN) 4 MG tablet Take 4 mg by mouth every 8 (eight) hours as needed for nausea or vomiting.     No current facility-administered medications on file prior to visit.    No Known Allergies  Family History  Problem Relation Age of Onset  . Thyroid disease Mother   . Diabetes Father   . Thyroid disease Brother     BP 138/80   Pulse 80   Ht 5' 6.5" (1.689 m)   Wt 254 lb 9.6 oz (115.5 kg)   SpO2 100%   BMI 40.48 kg/m     Review of Systems denies palpitations, sob, tremor, and heat intolerance.  She has  tingling on the left side of the body.  She has anxiety and night sweats.  Muscle strength is improved.       Objective:   Physical Exam VS: see vs page GEN: no distress HEAD: head: no deformity eyes: no periorbital swelling, no proptosis external nose and ears are normal NECK: supple, thyroid is not enlarged CHEST WALL: no deformity LUNGS: clear to auscultation CV: reg rate and rhythm, no murmur.  MUSCULOSKELETAL: muscle bulk and strength are grossly normal.  no obvious joint swelling.  gait is normal and steady.   EXTEMITIES: no deformity.  no edema PULSES: no carotid bruit NEURO:  cn 2-12 grossly intact.   readily moves all 4's.  sensation is intact to touch on all 4's SKIN:  Normal texture and temperature.  No rash or suspicious lesion is visible.   NODES:  None palpable at the neck PSYCH: alert, well-oriented.  Does not appear anxious nor depressed.     I have reviewed outside records, and summarized: Pt was noted to have suppressed TSH, and referred here.  She was seen for MS. TFT was checked to look for other cause of sxs.    Lab Results  Component Value Date   TSH 0.356 (L) 07/10/2019   T4TOTAL 7.6 07/10/2019   Lab Results  Component Value Date   TSH 0.84 08/28/2019   T4TOTAL 7.6 07/10/2019      Assessment & Plan:  Hyperthyroidism, mild.  This is usually due to MNG.  Normal on recheck.  I told pt hyperthyroidism will recur with time.    Patient Instructions  Blood tests are requested for you today.  We'll let you know about the results.  If it is high again, I'll prescribe for you a pill to slow it down.   If it is normal, we should follow it, as it could become overactive again.   Please come back for a follow-up appointment in 2 months.      Hyperthyroidism  Hyperthyroidism is when the thyroid gland is too active (overactive). The thyroid gland is a small gland located in the lower front part of the neck, just in front of the windpipe (trachea). This gland makes hormones that help control how the body uses food for energy (metabolism) as well as how the heart and brain function. These hormones also play a role in keeping your bones strong. When the thyroid is overactive, it produces too much of a hormone called thyroxine. What are the causes? This condition may be caused by:  Graves' disease. This is a disorder in which the body's disease-fighting system (immune system) attacks the thyroid gland. This is the most common cause.  Inflammation of the thyroid gland.  A tumor in the thyroid gland.  Use of certain medicines, including: ? Prescription thyroid hormone replacement. ? Herbal supplements that mimic thyroid hormones. ? Amiodarone therapy.  Solid or fluid-filled lumps within your thyroid gland (thyroid nodules).  Taking in a large amount of iodine from foods or medicines. What increases the risk? You  are more likely to develop this condition if:  You are female.  You have a family history of thyroid conditions.  You smoke tobacco.  You use a medicine called lithium.  You take medicines that affect the immune system (immunosuppressants). What are the signs or symptoms? Symptoms of this condition include:  Nervousness.  Inability to tolerate heat.  Unexplained weight loss.  Diarrhea.  Change in the texture of hair or skin.  Heart skipping beats or making  extra beats.  Rapid heart rate.  Loss of menstruation.  Shaky hands.  Fatigue.  Restlessness.  Sleep problems.  Enlarged thyroid gland or a lump in the thyroid (nodule). You may also have symptoms of Graves' disease, which may include:  Protruding eyes.  Dry eyes.  Red or swollen eyes.  Problems with vision. How is this diagnosed? This condition may be diagnosed based on:  Your symptoms and medical history.  A physical exam.  Blood tests.  Thyroid ultrasound. This test involves using sound waves to produce images of the thyroid gland.  A thyroid scan. A radioactive substance is injected into a vein, and images show how much iodine is present in the thyroid.  Radioactive iodine uptake test (RAIU). A small amount of radioactive iodine is given by mouth to see how much iodine the thyroid absorbs after a certain amount of time. How is this treated? Treatment depends on the cause and severity of the condition. Treatment may include:  Medicines to reduce the amount of thyroid hormone your body makes.  Radioactive iodine treatment (radioiodine therapy). This involves swallowing a small dose of radioactive iodine, in capsule or liquid form, to kill thyroid cells.  Surgery to remove part or all of your thyroid gland. You may need to take thyroid hormone replacement medicine for the rest of your life after thyroid surgery.  Medicines to help manage your symptoms. Follow these instructions at  home:   Take over-the-counter and prescription medicines only as told by your health care provider.  Do not use any products that contain nicotine or tobacco, such as cigarettes and e-cigarettes. If you need help quitting, ask your health care provider.  Follow any instructions from your health care provider about diet. You may be instructed to limit foods that contain iodine.  Keep all follow-up visits as told by your health care provider. This is important. ? You will need to have blood tests regularly so that your health care provider can monitor your condition. Contact a health care provider if:  Your symptoms do not get better with treatment.  You have a fever.  You are taking thyroid hormone replacement medicine and you: ? Have symptoms of depression. ? Feel like you are tired all the time. ? Gain weight. Get help right away if:  You have chest pain.  You have decreased alertness or a change in your awareness.  You have abdominal pain.  You feel dizzy.  You have a rapid heartbeat.  You have an irregular heartbeat.  You have difficulty breathing. Summary  The thyroid gland is a small gland located in the lower front part of the neck, just in front of the windpipe (trachea).  Hyperthyroidism is when the thyroid gland is too active (overactive) and produces too much of a hormone called thyroxine.  The most common cause is Graves' disease, a disorder in which your immune system attacks the thyroid gland.  Hyperthyroidism can cause various symptoms, such as unexplained weight loss, nervousness, inability to tolerate heat, or changes in your heartbeat.  Treatment may include medicine to reduce the amount of thyroid hormone your body makes, radioiodine therapy, surgery, or medicines to manage symptoms. This information is not intended to replace advice given to you by your health care provider. Make sure you discuss any questions you have with your health care  provider. Document Revised: 01/27/2017 Document Reviewed: 01/25/2017 Elsevier Patient Education  2020 [employer].

## 2019-08-28 NOTE — Patient Instructions (Addendum)
Blood tests are requested for you today.  We'll let you know about the results.  If it is high again, I'll prescribe for you a pill to slow it down.   If it is normal, we should follow it, as it could become overactive again.   Please come back for a follow-up appointment in 2 months.      Hyperthyroidism  Hyperthyroidism is when the thyroid gland is too active (overactive). The thyroid gland is a small gland located in the lower front part of the neck, just in front of the windpipe (trachea). This gland makes hormones that help control how the body uses food for energy (metabolism) as well as how the heart and brain function. These hormones also play a role in keeping your bones strong. When the thyroid is overactive, it produces too much of a hormone called thyroxine. What are the causes? This condition may be caused by:  Graves' disease. This is a disorder in which the body's disease-fighting system (immune system) attacks the thyroid gland. This is the most common cause.  Inflammation of the thyroid gland.  A tumor in the thyroid gland.  Use of certain medicines, including: ? Prescription thyroid hormone replacement. ? Herbal supplements that mimic thyroid hormones. ? Amiodarone therapy.  Solid or fluid-filled lumps within your thyroid gland (thyroid nodules).  Taking in a large amount of iodine from foods or medicines. What increases the risk? You are more likely to develop this condition if:  You are female.  You have a family history of thyroid conditions.  You smoke tobacco.  You use a medicine called lithium.  You take medicines that affect the immune system (immunosuppressants). What are the signs or symptoms? Symptoms of this condition include:  Nervousness.  Inability to tolerate heat.  Unexplained weight loss.  Diarrhea.  Change in the texture of hair or skin.  Heart skipping beats or making extra beats.  Rapid heart rate.  Loss of  menstruation.  Shaky hands.  Fatigue.  Restlessness.  Sleep problems.  Enlarged thyroid gland or a lump in the thyroid (nodule). You may also have symptoms of Graves' disease, which may include:  Protruding eyes.  Dry eyes.  Red or swollen eyes.  Problems with vision. How is this diagnosed? This condition may be diagnosed based on:  Your symptoms and medical history.  A physical exam.  Blood tests.  Thyroid ultrasound. This test involves using sound waves to produce images of the thyroid gland.  A thyroid scan. A radioactive substance is injected into a vein, and images show how much iodine is present in the thyroid.  Radioactive iodine uptake test (RAIU). A small amount of radioactive iodine is given by mouth to see how much iodine the thyroid absorbs after a certain amount of time. How is this treated? Treatment depends on the cause and severity of the condition. Treatment may include:  Medicines to reduce the amount of thyroid hormone your body makes.  Radioactive iodine treatment (radioiodine therapy). This involves swallowing a small dose of radioactive iodine, in capsule or liquid form, to kill thyroid cells.  Surgery to remove part or all of your thyroid gland. You may need to take thyroid hormone replacement medicine for the rest of your life after thyroid surgery.  Medicines to help manage your symptoms. Follow these instructions at home:   Take over-the-counter and prescription medicines only as told by your health care provider.  Do not use any products that contain nicotine or tobacco, such as cigarettes  and e-cigarettes. If you need help quitting, ask your health care provider.  Follow any instructions from your health care provider about diet. You may be instructed to limit foods that contain iodine.  Keep all follow-up visits as told by your health care provider. This is important. ? You will need to have blood tests regularly so that your health  care provider can monitor your condition. Contact a health care provider if:  Your symptoms do not get better with treatment.  You have a fever.  You are taking thyroid hormone replacement medicine and you: ? Have symptoms of depression. ? Feel like you are tired all the time. ? Gain weight. Get help right away if:  You have chest pain.  You have decreased alertness or a change in your awareness.  You have abdominal pain.  You feel dizzy.  You have a rapid heartbeat.  You have an irregular heartbeat.  You have difficulty breathing. Summary  The thyroid gland is a small gland located in the lower front part of the neck, just in front of the windpipe (trachea).  Hyperthyroidism is when the thyroid gland is too active (overactive) and produces too much of a hormone called thyroxine.  The most common cause is Graves' disease, a disorder in which your immune system attacks the thyroid gland.  Hyperthyroidism can cause various symptoms, such as unexplained weight loss, nervousness, inability to tolerate heat, or changes in your heartbeat.  Treatment may include medicine to reduce the amount of thyroid hormone your body makes, radioiodine therapy, surgery, or medicines to manage symptoms. This information is not intended to replace advice given to you by your health care provider. Make sure you discuss any questions you have with your health care provider. Document Revised: 01/27/2017 Document Reviewed: 01/25/2017 Elsevier Patient Education  2020 [employer].

## 2019-08-29 LAB — TSH: TSH: 0.84 u[IU]/mL (ref 0.35–4.50)

## 2019-08-29 LAB — T4, FREE: Free T4: 1.05 ng/dL (ref 0.60–1.60)

## 2019-10-23 ENCOUNTER — Telehealth: Payer: Self-pay | Admitting: Neurology

## 2019-10-23 NOTE — Telephone Encounter (Signed)
Patient is requesting to switch her care from Dr. Terrace Arabia to Dr. Anne Hahn. Patient was last seen by Dr. Terrace Arabia for MS. Please advise if this switch is acceptable

## 2019-10-24 NOTE — Telephone Encounter (Signed)
Ok for switch, but given fact I will be retiring soon, may be best to consider sater.

## 2019-10-29 ENCOUNTER — Telehealth: Payer: Self-pay | Admitting: Neurology

## 2019-10-29 NOTE — Telephone Encounter (Signed)
It is ok for her to see DR. Anne Hahn

## 2019-10-29 NOTE — Telephone Encounter (Signed)
Spoke with patients mother, Misty Stanley, regarding requested provider switch and advised her that both Dr. Terrace Arabia and Dr. Anne Hahn approved same. Patient is going to be a patient of Dr. Anne Hahn.

## 2019-10-29 NOTE — Telephone Encounter (Signed)
Spoke with patients

## 2019-10-30 ENCOUNTER — Other Ambulatory Visit: Payer: Self-pay

## 2019-10-30 ENCOUNTER — Ambulatory Visit (INDEPENDENT_AMBULATORY_CARE_PROVIDER_SITE_OTHER): Payer: 59 | Admitting: Endocrinology

## 2019-10-30 ENCOUNTER — Encounter: Payer: Self-pay | Admitting: Endocrinology

## 2019-10-30 VITALS — BP 124/78 | HR 75 | Ht 66.5 in | Wt 248.6 lb

## 2019-10-30 DIAGNOSIS — E0590 Thyrotoxicosis, unspecified without thyrotoxic crisis or storm: Secondary | ICD-10-CM

## 2019-10-30 LAB — TSH: TSH: 0.38 u[IU]/mL (ref 0.35–4.50)

## 2019-10-30 LAB — T4, FREE: Free T4: 0.86 ng/dL (ref 0.60–1.60)

## 2019-10-30 NOTE — Patient Instructions (Addendum)
Blood tests are requested for you today.  We'll let you know about the results.  If it is high again, I'll prescribe for you a pill to slow it down.   If it is normal, you should have it rechecked every 4-6 months, as it could become abnormal again.

## 2019-10-30 NOTE — Progress Notes (Signed)
Subjective:    Patient ID: Julie Wagner, female    DOB: [birth year], 24 y.o.   MRN: [id number]  HPI Pt returns for f/u of hyperthyroidism (dx'ed 2021; she has never been on therapy for this, as f/u TFT have been normal; she has never had thyroid imaging).  pt states she feels well in general.   Past Medical History:  Diagnosis Date  . Incoordination     Past Surgical History:  Procedure Laterality Date  . REFRACTIVE SURGERY Bilateral     Social History   Socioeconomic History  . Marital status: Single    Spouse name: Not on file  . Number of children: 0  . Years of education: college  . Highest education level: Not on file  Occupational History  . Occupation: Emergency planning/management officer  Tobacco Use  . Smoking status: Never Smoker  . Smokeless tobacco: Never Used  Vaping Use  . Vaping Use: Never used  Substance and Sexual Activity  . Alcohol use: Never  . Drug use: Never  . Sexual activity: Not on file  Other Topics Concern  . Not on file  Social History Narrative   Lives at home with boyfriend.   Right-handed.   Drinks coffee, with extra espresso, in the mornings.  Occasionally uses caffeinated energy powder.   She works as a Emergency planning/management officer in [place].   Social Determinants of Health   Financial Resource Strain:   . Difficulty of Paying Living Expenses: Not on file  Food Insecurity:   . Worried About Programme researcher, broadcasting/film/video in the Last Year: Not on file  . Ran Out of Food in the Last Year: Not on file  Transportation Needs:   . Lack of Transportation (Medical): Not on file  . Lack of Transportation (Non-Medical): Not on file  Physical Activity:   . Days of Exercise per Week: Not on file  . Minutes of Exercise per Session: Not on file  Stress:   . Feeling of Stress : Not on file  Social Connections:   . Frequency of Communication with Friends and Family: Not on file  . Frequency of Social Gatherings with Friends and Family: Not on file  . Attends Religious Services:  Not on file  . Active Member of Clubs or Organizations: Not on file  . Attends Banker Meetings: Not on file  . Marital Status: Not on file  Intimate Partner Violence:   . Fear of Current or Ex-Partner: Not on file  . Emotionally Abused: Not on file  . Physically Abused: Not on file  . Sexually Abused: Not on file    Current Outpatient Medications on File Prior to Visit  Medication Sig Dispense Refill  . cetirizine (ZYRTEC) 10 MG tablet Take 10 mg by mouth as needed for allergies.    Marland Kitchen ondansetron (ZOFRAN) 4 MG tablet Take 4 mg by mouth every 8 (eight) hours as needed for nausea or vomiting.     No current facility-administered medications on file prior to visit.    No Known Allergies  Family History  Problem Relation Age of Onset  . Thyroid disease Mother   . Diabetes Father   . Thyroid disease Brother     BP 124/78   Pulse 75   Ht 5' 6.5" (1.689 m)   Wt 248 lb 9.6 oz (112.8 kg)   SpO2 98%   BMI 39.52 kg/m    Review of Systems Denies neck swelling and pain.     Objective:  Physical Exam VITAL SIGNS:  See vs page GENERAL: no distress NECK: There is no palpable thyroid enlargement.  No thyroid nodule is palpable.  No palpable lymphadenopathy at the anterior neck.   Lab Results  Component Value Date   TSH 0.38 10/30/2019   T4TOTAL 7.6 07/10/2019       Assessment & Plan:  Hyperthyroidism: stable, but she is at risk for recurrence.  No medication is needed  Patient Instructions  Blood tests are requested for you today.  We'll let you know about the results.  If it is high again, I'll prescribe for you a pill to slow it down.   If it is normal, you should have it rechecked every 4-6 months, as it could become abnormal again.

## 2020-02-10 ENCOUNTER — Telehealth: Payer: Self-pay | Admitting: Neurology

## 2020-02-10 NOTE — Telephone Encounter (Signed)
Pt. wants to speak with RN regarding MS medication. Please advise.

## 2020-02-10 NOTE — Telephone Encounter (Signed)
I spoke to the patient. She would like to see Dr. Despina Arias and establish care for her MS. She is currently on [employer] and due for her second year in January 2022.  Per vo by Dr. Epimenio Foot, he will be glad to see her. She has been schedule with him on 02/12/20 at 1:30pm (check-in time 1pm).  I called [facility] [phone]) and spoke to [place]. She updated the patient's profile with the new provider information.

## 2020-02-12 ENCOUNTER — Ambulatory Visit (INDEPENDENT_AMBULATORY_CARE_PROVIDER_SITE_OTHER): Payer: 59 | Admitting: Neurology

## 2020-02-12 ENCOUNTER — Telehealth: Payer: Self-pay | Admitting: *Deleted

## 2020-02-12 ENCOUNTER — Encounter: Payer: Self-pay | Admitting: Neurology

## 2020-02-12 VITALS — BP 139/89 | HR 80 | Ht 66.5 in | Wt 255.2 lb

## 2020-02-12 DIAGNOSIS — Z79899 Other long term (current) drug therapy: Secondary | ICD-10-CM | POA: Diagnosis not present

## 2020-02-12 DIAGNOSIS — G35 Multiple sclerosis: Secondary | ICD-10-CM | POA: Diagnosis not present

## 2020-02-12 DIAGNOSIS — G43009 Migraine without aura, not intractable, without status migrainosus: Secondary | ICD-10-CM

## 2020-02-12 DIAGNOSIS — F419 Anxiety disorder, unspecified: Secondary | ICD-10-CM

## 2020-02-12 DIAGNOSIS — R531 Weakness: Secondary | ICD-10-CM

## 2020-02-12 MED ORDER — RIZATRIPTAN BENZOATE 10 MG PO TBDP: 10.0000 mg | ORAL_TABLET | ORAL | 11 refills | Status: DC | PRN

## 2020-02-12 NOTE — Telephone Encounter (Addendum)
Faxed completed/signed [employer] start form for year 2 to [facility] at [phone]. Received fax confirmation.  Submitted PA on CMM. Key: [id number] - PA Case ID: [id number]. Marked urgent. Waiting on determination from [facility].   PA approved until 04/14/2020. Request Reference Number: [id number].

## 2020-02-12 NOTE — Progress Notes (Signed)
[facility]  PATIENT: Julie Wagner DOB: 1995/04/13  REFERRING DOCTOR OR PCP: Levert Feinstein MD SOURCE: Patient, notes from Dr. Kennedy Bucker, lab reports, imaging results, MRI images personally reviewed.  _________________________________   HISTORICAL  CHIEF COMPLAINT:  Chief Complaint  Patient presents with  . Consult    RM 13. Transfer of care from Dr. Terrace Arabia. Patient currently on Mavenclad for MS.  Took year one month 1, 03/2019. Took month 2, 04/2019. Due for 2nd year. Would like to proceed if cleared for therapy. Denies any vision problems.    HISTORY OF PRESENT ILLNESS:  I had the pleasure seeing your patient, Julie Wagner, at the MS center [facility] for neurologic consultation regarding her multiple sclerosis.  She is a 24 year old woman who was diagnosed with MS January 2021 after presenting with left-sided weakness.  She was started on Mavenclad and has completed the first year and will be due for her second year in about a month.    She had the first cycle of Mavenclad January and February 2021.  She had some headaches and nausea for a few days each course but nothing severe.   She had no rash or itching.  She has not had any exacerbation or new neurologic symptom since starting.  Currently, she feels baseline and notes no difficulty with gait, balance, strength or sensation.   She denies change in bladder function (had baseline urinary frequency).  Vision is fine.   She notes mild fatigue but not much different than before MS.   She woks a late shift - typically 4 pm to 3 am.    She sleeps well most nights.   She has depression and anxiety --- the anxiety is worse this year but the depression is the same.    She noted some mild cognitive issues last January but is back to baseline now.      She has migraine headaches several times   MS history: She had the onset of left sided weakness in January 2021.    An MRI of the brain showed a large  tumefactive lesion in the right frontal lobe associated with mass-effect and 5 other enhancing lesions that were smaller.  She also had a lumbar puncture performed a week later showing oligoclonal bands.  MRI of the spine did not show any MS lesions.  Her left-sided symptoms improved over time.   She was started on [facility] and took the first course in late January in late February.  Imaging: MRI of the brain 03/06/2019 showed a large heterogenous rim-enhancing lesion in the right frontal lobe associated with mass-effect.  There are also 5 other enhancing lesions that are smaller and several other T2/FLAIR hyperintense foci that do not enhance.  The combination of enhancing and nonenhancing lesions is consistent with a diagnosis of multiple sclerosis.  The large focus likely represents a tumefactive lesion while the other enhancing lesions are more typical for MS.  MRI of the cervical and thoracic spine 04/12/2019 did not show any lesions within the spinal cord.  Labs: CSF 03/13/2019 showed at least 5 oligoclonal bands.  Additionally the IgG index was elevated at 1.34.  Labs 03/07/2019: Hepatitis B, TB, Lyme, HIV were negative.  She is varicella immune (1491).  Vitamin D was mildly reduced.  REVIEW OF SYSTEMS: Constitutional: No fevers, chills, sweats, or change in appetite Eyes: No visual changes, double vision, eye pain Ear, nose and throat: No hearing loss, ear pain, nasal congestion, sore throat Cardiovascular: No  chest pain, palpitations Respiratory: No shortness of breath at rest or with exertion.   No wheezes GastrointestinaI: No nausea, vomiting, diarrhea, abdominal pain, fecal incontinence Genitourinary: No dysuria, urinary retention or frequency.  No nocturia. Musculoskeletal: No neck pain, back pain Integumentary: No rash, pruritus, skin lesions Neurological: as above Psychiatric: No depression at this time.  No anxiety Endocrine: No palpitations, diaphoresis, change in appetite,  change in weigh or increased thirst Hematologic/Lymphatic: No anemia, purpura, petechiae. Allergic/Immunologic: No itchy/runny eyes, nasal congestion, recent allergic reactions, rashes  ALLERGIES: No Known Allergies  HOME MEDICATIONS:  Current Outpatient Medications:  .  ondansetron (ZOFRAN) 4 MG tablet, Take 4 mg by mouth every 8 (eight) hours as needed for nausea or vomiting., Disp: , Rfl:  .  VITAMIN D PO, Take 1 Dose by mouth daily., Disp: , Rfl:  .  rizatriptan (MAXALT-MLT) 10 MG disintegrating tablet, Take 1 tablet (10 mg total) by mouth as needed for migraine. May repeat in 2 hours if needed, Disp: 9 tablet, Rfl: 11  PAST MEDICAL HISTORY: Past Medical History:  Diagnosis Date  . Incoordination     PAST SURGICAL HISTORY: Past Surgical History:  Procedure Laterality Date  . LASIK Bilateral 2017  . REFRACTIVE SURGERY Bilateral     FAMILY HISTORY: Family History  Problem Relation Age of Onset  . Thyroid disease Mother   . Diabetes Father   . Thyroid disease Brother     SOCIAL HISTORY:  Social History   Socioeconomic History  . Marital status: Single    Spouse name: Not on file  . Number of children: 0  . Years of education: college  . Highest education level: Not on file  Occupational History  . Occupation: Emergency planning/management officer  Tobacco Use  . Smoking status: Never Smoker  . Smokeless tobacco: Never Used  Vaping Use  . Vaping Use: Never used  Substance and Sexual Activity  . Alcohol use: Yes    Comment: very little  . Drug use: Never  . Sexual activity: Not on file  Other Topics Concern  . Not on file  Social History Narrative   Lives at home with boyfriend.   Right-handed.   Drinks coffee, with extra espresso, in the mornings.  Occasionally uses caffeinated energy powder. (about 600mg  per day)   She works as a in [place].   Social Determinants of Health   Financial Resource Strain: Not on file  Food Insecurity: Not on file   Transportation Needs: Not on file  Physical Activity: Not on file  Stress: Not on file  Social Connections: Not on file  Intimate Partner Violence: Not on file     PHYSICAL EXAM  Vitals:   02/12/20 1304  BP: 139/89  Pulse: 80  Weight: 255 lb 3.2 oz (115.8 kg)  Height: 5' 6.5" (1.689 m)    Body mass index is 40.57 kg/m.   General: The patient is well-developed and well-nourished and in no acute distress  HEENT:  Head is [place]/AT.  Sclera are anicteric.    Skin: Extremities are without rash or  edema.  Neurologic Exam  Mental status: The patient is alert and oriented x 3 at the time of the examination. The patient has apparent normal recent and remote memory, with an apparently normal attention span and concentration ability.   Speech is normal.  Cranial nerves: Extraocular movements are full.  Facial symmetry is present. There is good facial sensation to soft touch bilaterally.Facial strength is normal.  Trapezius and sternocleidomastoid strength  is normal. No dysarthria is noted.  The tongue is midline, and the patient has symmetric elevation of the soft palate. No obvious hearing deficits are noted.  Motor:  Muscle bulk is normal.   Tone is normal. Strength is  5 / 5 in all 4 extremities.   Sensory: Sensory testing is intact to pinprick, soft touch and vibration sensation in all 4 extremities.  Coordination: Cerebellar testing reveals good finger-nose-finger and heel-to-shin bilaterally.  Gait and station: Station is normal.   Gait is normal. Tandem gait is normal. Romberg is negative.   Reflexes: Deep tendon reflexes are symmetric and normal bilaterally.        DIAGNOSTIC DATA (LABS, IMAGING, TESTING) - I reviewed patient records, labs, notes, testing and imaging myself where available.  Lab Results  Component Value Date   WBC 9.1 07/09/2019   HGB 14.1 07/09/2019   HCT 42.2 07/09/2019   MCV 93 07/09/2019   PLT 300 03/28/2019      Component Value Date/Time    NA 139 07/09/2019 1153   K 3.9 07/09/2019 1153   CL 104 07/09/2019 1153   CO2 23 07/09/2019 1153   GLUCOSE 90 07/09/2019 1153   BUN 8 07/09/2019 1153   CREATININE 0.72 07/09/2019 1153   CALCIUM 8.9 07/09/2019 1153   PROT 7.0 07/09/2019 1153   ALBUMIN 4.3 07/09/2019 1153   ALBUMIN 4.8 03/13/2019 0909   AST 18 07/09/2019 1153   ALT 10 07/09/2019 1153   ALKPHOS 76 07/09/2019 1153   BILITOT 0.3 07/09/2019 1153   GFRNONAA 117 07/09/2019 1153   GFRAA 135 07/09/2019 1153   No results found for: CHOL, HDL, LDLCALC, LDLDIRECT, TRIG, CHOLHDL No results found for: NIDP8E Lab Results  Component Value Date   VITAMINB12 827 02/26/2019   Lab Results  Component Value Date   TSH 0.38 10/30/2019       ASSESSMENT AND PLAN  Multiple sclerosis, relapsing-remitting (HCC) - Plan: QuantiFERON-TB Gold Plus, CBC with Differential/Platelet, Hepatitis B core antibody, total, HIV Antibody (routine testing w rflx), Hepatitis B surface antibody,qualitative, Hepatitis B surface antigen, Comprehensive metabolic panel  High risk medication use - Plan: QuantiFERON-TB Gold Plus, CBC with Differential/Platelet, Hepatitis B core antibody, total, HIV Antibody (routine testing w rflx), Hepatitis B surface antibody,qualitative, Hepatitis B surface antigen, Comprehensive metabolic panel  Left-sided weakness  Anxiety  Migraine without aura and without status migrainosus, not intractable   1.   She will do the second year Mavenclad starting late January.  The second half will be late February and she will follow-up with me in late March to check lab work.  Check blood for chronic infections and also check CBC with differential and CMP. 2.    We discussed the COVID-19 vaccination.  It would be best for her to get the vaccine this week and the second shot in 3 weeks.  Then at least 2 weeks should go by after the second shot before she goes on Mavenclad to allow the dose to be effective. 3.    Maxalt for migraine   4.    She will return in 3 -4  months or sooner if there are new or worsening neurologic symptoms.   Zendayah Hardgrave A. Epimenio Foot, MD, [facility] 02/12/2020, 3:16 PM Certified in Neurology, Clinical Neurophysiology, Sleep Medicine and Neuroimaging  [facility] [address] [place], [place] [address] [phone]

## 2020-02-13 ENCOUNTER — Telehealth: Payer: Self-pay | Admitting: Neurology

## 2020-02-13 NOTE — Telephone Encounter (Signed)
Per Dr. Epimenio Foot, she was told to start 2nd yr of Mavenclad a few weeks late in order to get covid-19 vaccination. She is getting vaccine now.

## 2020-02-13 NOTE — Telephone Encounter (Signed)
Called [name] back. Advised per Dr. Bonnita Hollow office note that he would like her to start year 2, month 1 end of January. He states pt took year 1, month 2; 05/01/19-05/05/19. Target date for year 2 would actually be beginning of January. He will make note that MD wants her to start end of January unless we call back and advise otherwise. Advised I will send message to MD to verify.

## 2020-02-13 NOTE — Telephone Encounter (Signed)
error 

## 2020-02-13 NOTE — Telephone Encounter (Signed)
[name] from [facility] called wanting to get a verbal for the pt to be able to start yr 2 on her Mavenclad. The box was not checked off. Please advise. [phone]

## 2020-02-14 LAB — CBC WITH DIFFERENTIAL/PLATELET
Basophils Absolute: 0.1 10*3/uL (ref 0.0–0.2)
Basos: 1 %
EOS (ABSOLUTE): 0.1 10*3/uL (ref 0.0–0.4)
Eos: 1 %
Hematocrit: 43 % (ref 34.0–46.6)
Hemoglobin: 14.3 g/dL (ref 11.1–15.9)
Immature Grans (Abs): 0 10*3/uL (ref 0.0–0.1)
Immature Granulocytes: 0 %
Lymphocytes Absolute: 1.3 10*3/uL (ref 0.7–3.1)
Lymphs: 14 %
MCH: 30.2 pg (ref 26.6–33.0)
MCHC: 33.3 g/dL (ref 31.5–35.7)
MCV: 91 fL (ref 79–97)
Monocytes Absolute: 1 10*3/uL — ABNORMAL HIGH (ref 0.1–0.9)
Monocytes: 10 %
Neutrophils Absolute: 7.2 10*3/uL — ABNORMAL HIGH (ref 1.4–7.0)
Neutrophils: 74 %
Platelets: 305 10*3/uL (ref 150–450)
RBC: 4.74 x10E6/uL (ref 3.77–5.28)
RDW: 12.3 % (ref 11.7–15.4)
WBC: 9.7 10*3/uL (ref 3.4–10.8)

## 2020-02-14 LAB — QUANTIFERON-TB GOLD PLUS
QuantiFERON Mitogen Value: >10 IU/mL
QuantiFERON Nil Value: 0 IU/mL
QuantiFERON TB1 Ag Value: 0.01 IU/mL
QuantiFERON TB2 Ag Value: 0.03 IU/mL
QuantiFERON-TB Gold Plus: NEGATIVE

## 2020-02-14 LAB — COMPREHENSIVE METABOLIC PANEL
ALT: 11 IU/L (ref 0–32)
AST: 14 IU/L (ref 0–40)
Albumin/Globulin Ratio: 1.8 (ref 1.2–2.2)
Albumin: 4.8 g/dL (ref 3.9–5.0)
Alkaline Phosphatase: 90 IU/L (ref 44–121)
BUN/Creatinine Ratio: 12 (ref 9–23)
BUN: 9 mg/dL (ref 6–20)
Bilirubin Total: <0.2 mg/dL (ref 0.0–1.2)
CO2: 22 mmol/L (ref 20–29)
Calcium: 9.4 mg/dL (ref 8.7–10.2)
Chloride: 102 mmol/L (ref 96–106)
Creatinine, Ser: 0.74 mg/dL (ref 0.57–1.00)
GFR calc Af Amer: 129 mL/min/{1.73_m2} (ref >59)
GFR calc non Af Amer: 112 mL/min/{1.73_m2} (ref >59)
Globulin, Total: 2.6 g/dL (ref 1.5–4.5)
Glucose: 84 mg/dL (ref 65–99)
Potassium: 4 mmol/L (ref 3.5–5.2)
Sodium: 140 mmol/L (ref 134–144)
Total Protein: 7.4 g/dL (ref 6.0–8.5)

## 2020-02-14 LAB — HEPATITIS B SURFACE ANTIGEN: Hepatitis B Surface Ag: NEGATIVE

## 2020-02-14 LAB — HIV ANTIBODY (ROUTINE TESTING W REFLEX): HIV Screen 4th Generation wRfx: NONREACTIVE

## 2020-02-14 LAB — HEPATITIS B SURFACE ANTIBODY,QUALITATIVE: Hep B Surface Ab, Qual: NONREACTIVE

## 2020-02-14 LAB — HEPATITIS B CORE ANTIBODY, TOTAL: Hep B Core Total Ab: NEGATIVE

## 2020-02-17 ENCOUNTER — Telehealth: Payer: Self-pay | Admitting: *Deleted

## 2020-02-17 NOTE — Telephone Encounter (Signed)
-----   Message from Asa Lente, MD sent at 02/14/2020  3:01 PM EST ----- Her labs are fine.  She can start Mavenclad 2 to 3 weeks after her second Covid vaccination shot (she was going to get the flu shot this week)

## 2020-02-17 NOTE — Telephone Encounter (Signed)
Called and spoke with pt about lab results per Dr. Sater note. Pt verbalized understanding.  

## 2020-02-18 ENCOUNTER — Telehealth: Payer: Self-pay | Admitting: *Deleted

## 2020-02-18 ENCOUNTER — Ambulatory Visit (INDEPENDENT_AMBULATORY_CARE_PROVIDER_SITE_OTHER): Payer: 59

## 2020-02-18 DIAGNOSIS — IMO0002 Reserved for concepts with insufficient information to code with codable children: Secondary | ICD-10-CM

## 2020-02-18 DIAGNOSIS — G35 Multiple sclerosis: Secondary | ICD-10-CM | POA: Diagnosis not present

## 2020-02-18 DIAGNOSIS — F419 Anxiety disorder, unspecified: Secondary | ICD-10-CM

## 2020-02-18 MED ORDER — GADOBENATE DIMEGLUMINE 529 MG/ML IV SOLN
20.0000 mL | Freq: Once | INTRAVENOUS | Status: AC | PRN
  Administered 2020-02-18: 20 mL via INTRAVENOUS

## 2020-02-18 NOTE — Telephone Encounter (Signed)
MRI Brain w/wo 02/18/20:  IMPRESSION: This MRI of the brain with and without contrast shows the following: 1.   T2/FLAIR hyperintense foci in the cerebral hemispheres and one focus in the pons in a pattern and configuration consistent with chronic demyelinating plaque associated with multiple sclerosis.  None of the foci appear to be acute.  They do not enhance.  There are no new lesions compared to the MRI dated 03/06/2019. 2.    Foci that were enhancing on the 03/06/2019 MRI no longer do so.  Those foci are smaller on the current MRI, consistent with typical evolution of MS plaque. 3.    Normal enhancement pattern.  No acute findings.  Per vo by Dr.Sater, no new lesions compared to the MRI dated 03/06/2019. She verbalized understanding of the findings.

## 2020-02-18 NOTE — Telephone Encounter (Signed)
-----   Message from Lilla Shook, RN sent at 02/18/2020 10:29 AM EST -----  ----- Message ----- From: Asa Lente, MD Sent: 02/18/2020  10:24 AM EST To: Levert Feinstein, MD

## 2020-03-03 ENCOUNTER — Other Ambulatory Visit: Payer: Self-pay | Admitting: Neurology

## 2020-03-03 ENCOUNTER — Telehealth: Payer: Self-pay

## 2020-03-03 NOTE — Telephone Encounter (Signed)
Per CMM, This medication or product was previously approved on [id number] from 02/12/2020 to 04/14/2020.

## 2020-03-03 NOTE — Telephone Encounter (Signed)
I have submitted PA for [facility] on [facility], Key: [facility] - PA Case ID: [id number].   Awaiting determination

## 2020-03-04 ENCOUNTER — Telehealth: Payer: Self-pay | Admitting: Neurology

## 2020-03-04 NOTE — Telephone Encounter (Signed)
Took call from phone staff and spoke with Onalee Hua. They have not sent rx to specialty pharmacy yet. Pt was getting covid-19 vaccine first prior to starting med. She will start end of Jan per MD. They will send over script for specialty pharmacy to start processing for pt.

## 2020-03-04 NOTE — Telephone Encounter (Signed)
Jacqueline @ [facility] is asking for a call back re: pt's Cladribine, 10 Tabs, (MAVENCLAD, 10 TABS,) 10 MG TBPK They have not received a new script.  When calling please use [id number]

## 2020-03-04 NOTE — Telephone Encounter (Signed)
Called, LVM for [name] at [facility] at [phone]. Asked he call back to discuss patient

## 2020-03-10 NOTE — Telephone Encounter (Signed)
Pt called and stated that she does not want the vaccination but she does need her rx sent in to the pharmacy for her. Please advise. [phone]

## 2020-03-11 NOTE — Telephone Encounter (Signed)
Called and spoke with pt. Relayed MD message below. She verbalized understanding. She is going to call [facility] at [phone] to try and set up shipment of her Mavenclad. She will call back if she runs into any issues.

## 2020-03-11 NOTE — Telephone Encounter (Signed)
Pt's mother, Scott Fix (on [place]) called, there was a miscommunication yesterday when my daughter called about the covid vaccine. She's not getting the vaccine, just ask a question  if she could get it. She is out of her Cladribine, 10 Tabs, (MAVENCLAD, 10 TABS,) 10 MG TBPK, she needs a refill. Would like a call from the nurse.

## 2020-03-11 NOTE — Telephone Encounter (Signed)
Heather from [employer] called to state that the box is not checked on form if pt. is cleared to start therapy or not. She stated pt. called upset that this has not been completed as Herbert Seta states they have tried reaching out to [place] via fax & other methods to have this completed.  Best contact is [phone]

## 2020-03-11 NOTE — Telephone Encounter (Signed)
She can have her do the second year of Mavenclad without the COVID-19 vaccination.  If she does decide to get the vaccination, she should wait until 3 months after her last Mavenclad pill.  Labs were fine last month so she is cleared for the [facility]

## 2020-03-11 NOTE — Telephone Encounter (Signed)
Took call from phone staff and spoke with pt. She spoke w/ [facility]. They are needing Dr. Epimenio Foot to provide further info before filling rx for her. Advised I will call and then f/u with her.  I called [facility] at [phone]. Spoke w/ Tammy. They did not receive documentation as to whether patient is medically cleared to start therapy. Advised she is. She placed me on hold to speak with pharmacist to see if they can take clearance over the phone. Spoke with pharmacist, Susy Frizzle. He took verbal confirmation to clear pt for therapy.  I called pt back. Provided her update. She will call [facility] pharmacy back to process order and set up shipment.

## 2020-03-11 NOTE — Telephone Encounter (Signed)
Dr. Epimenio Foot- are you ok with her starting Mavenclad w/o covid-19 vaccination?

## 2020-03-17 NOTE — Telephone Encounter (Signed)
Received fax from [facility] that pt has scheduled delivery of Mavenclad and should receive it on 03/14/20 (this past Sat).

## 2020-03-23 NOTE — Telephone Encounter (Signed)
I called patient to discuss if she has received her Mavenclad and is tolerating it well.  No answer, left a message asking her to call us back.

## 2020-03-24 NOTE — Telephone Encounter (Signed)
Patient returned my call.  She received her Mavenclad shipment and today is her last day of this cycle.  She is tolerating it fairly well.  She did not have any questions or concerns.  She will call us back if she does have questions or concerns.

## 2020-04-27 NOTE — Telephone Encounter (Signed)
Received fax from [facility] that pt completed second year of Mavenclad. Last day of treatment: 04/21/20.

## 2020-05-11 ENCOUNTER — Telehealth: Payer: Self-pay | Admitting: Neurology

## 2020-05-11 ENCOUNTER — Other Ambulatory Visit: Payer: Self-pay | Admitting: *Deleted

## 2020-05-11 ENCOUNTER — Other Ambulatory Visit (INDEPENDENT_AMBULATORY_CARE_PROVIDER_SITE_OTHER): Payer: Self-pay

## 2020-05-11 DIAGNOSIS — N390 Urinary tract infection, site not specified: Secondary | ICD-10-CM

## 2020-05-11 DIAGNOSIS — Z0289 Encounter for other administrative examinations: Secondary | ICD-10-CM

## 2020-05-11 MED ORDER — CIPROFLOXACIN HCL 500 MG PO TABS: ORAL_TABLET | ORAL | 0 refills | Status: DC

## 2020-05-11 NOTE — Telephone Encounter (Signed)
Spoke with Dr. Epimenio Foot. He recommends the following: Cipro 500mg  #14. Directions: Take 1 tablet twice daily for 7 days. Check UA and culture

## 2020-05-11 NOTE — Telephone Encounter (Addendum)
Spoke with Dr. Epimenio Foot. If she develops yeast infection after taking cipro, he recommends either OTC treatment or he is willing to call in diflucan 150mg  to help treat.   I called pt back and relayed above info. She will call back if she develops yeast infection.

## 2020-05-11 NOTE — Telephone Encounter (Signed)
Pt called stating that she has a UTI and is wanting to know what she can do or take for this. Please advise.

## 2020-05-11 NOTE — Telephone Encounter (Signed)
Called pt. She will come in today to get UA/culture around 1pm. I placed her on lab schedule and placed orders. She has developed yeast infection in the past after being on antibiotics. Wondering if Dr. Epimenio Foot comfortable treating this if this occurs. Advised I will ask Dr. Epimenio Foot. He may want there to see OB for treatment if this occurs but I will speak with him and call her back. Placed order for Cipro.

## 2020-05-13 LAB — URINALYSIS, ROUTINE W REFLEX MICROSCOPIC
Bilirubin, UA: NEGATIVE
Glucose, UA: NEGATIVE
Ketones, UA: NEGATIVE
Leukocytes,UA: NEGATIVE
Nitrite, UA: NEGATIVE
Protein,UA: NEGATIVE
RBC, UA: NEGATIVE
Specific Gravity, UA: 1.021 (ref 1.005–1.030)
Urobilinogen, Ur: 0.2 mg/dL (ref 0.2–1.0)
pH, UA: 7.5 (ref 5.0–7.5)

## 2020-05-13 LAB — URINE CULTURE: Organism ID, Bacteria: NO GROWTH

## 2020-06-02 ENCOUNTER — Ambulatory Visit: Payer: 59 | Admitting: Neurology

## 2020-06-02 ENCOUNTER — Encounter: Payer: Self-pay | Admitting: Neurology

## 2020-06-02 VITALS — BP 121/82 | HR 81 | Ht 66.5 in | Wt 250.5 lb

## 2020-06-02 DIAGNOSIS — Z79899 Other long term (current) drug therapy: Secondary | ICD-10-CM | POA: Diagnosis not present

## 2020-06-02 DIAGNOSIS — G43009 Migraine without aura, not intractable, without status migrainosus: Secondary | ICD-10-CM

## 2020-06-02 DIAGNOSIS — R7989 Other specified abnormal findings of blood chemistry: Secondary | ICD-10-CM | POA: Diagnosis not present

## 2020-06-02 DIAGNOSIS — G35 Multiple sclerosis: Secondary | ICD-10-CM | POA: Diagnosis not present

## 2020-06-02 MED ORDER — RIZATRIPTAN BENZOATE 10 MG PO TBDP: 10.0000 mg | ORAL_TABLET | ORAL | 11 refills | Status: DC | PRN

## 2020-06-02 NOTE — Progress Notes (Signed)
[facility]  PATIENT: Julie Wagner DOB: 1995-10-21  REFERRING DOCTOR OR PCP: Levert Feinstein MD SOURCE: Patient, notes from Dr. Kennedy Bucker, lab reports, imaging results, MRI images personally reviewed.  _________________________________   HISTORICAL  CHIEF COMPLAINT:  Chief Complaint  Patient presents with  . Follow-up    RM 12, alone. Last seen 02/12/20. On Mavenclad for MS. Pt reports pains in hands. Started with first round of Mavenclad. Feels like cramps/not spasms. Hold something too tight, makes hands hurts for hours after. Sx intermittent. Has hard time losing weight. More fatigued. Also has tenderness in left forearm/no bruise present. Started two weeks ago. Out of rizatriptan. Took w/ Mavenclad d/t it causing headaches while she took it.    HISTORY OF PRESENT ILLNESS:  Julie Wagner is a 25 y.o. woman with relapsoing remitting multiple sclerosis.   Update 06/02/2020 She did the second year of Mavenclad January and February 2022.    She felt some aching inher hands and she continues to experience this.   She also had headaches.   She had allergic like issues but has noted some discoloration and itching in the right axilla.      Her MS seems stable.   She can walk unlimited (same as others) and she can go downstairs without the bannister.   Balance is fine.  She has no weakness but sometimes has tingling in her fingers and feet - less than in the past.   Vision is fine.   She has some urinary urgency but this is a long time issue.    She has migraines 2-4 times a month generally but it was daily during the [facility]   Maxalt has helped  She notes mild fatigue but not much different than before MS.    She sleeps well most nights.   No new mood issues, Cognition is fine.      She has migraine headaches several times .    TSH was borderline low in 2021.  Will recheck to assess for hyperthyroidism.     MS history: She had the onset of left sided weakness in  January 2021.    An MRI of the brain showed a large tumefactive lesion in the right frontal lobe associated with mass-effect and 5 other enhancing lesions that were smaller.  She also had a lumbar puncture performed a week later showing oligoclonal bands.  MRI of the spine did not show any MS lesions.  Her left-sided symptoms improved over time.   She was started on [facility] and took the first course in late January in late February 2021 and second year Jan/Feb 2022.    Imaging: MRI of the brain 03/06/2019 showed a large heterogenous rim-enhancing lesion in the right frontal lobe associated with mass-effect.  There are also 5 other enhancing lesions that are smaller and several other T2/FLAIR hyperintense foci that do not enhance.  The combination of enhancing and nonenhancing lesions is consistent with a diagnosis of multiple sclerosis.  The large focus likely represents a tumefactive lesion while the other enhancing lesions are more typical for MS.  MRI of the brain 02/18/2020 showed no new lesoins and resolution of the enhancement seen on previous MRI.    MRI of the cervical and thoracic spine 04/12/2019 did not show any lesions within the spinal cord.  Labs: CSF 03/13/2019 showed at least 5 oligoclonal bands.  Additionally the IgG index was elevated at 1.34.  Labs 03/07/2019: Hepatitis B, TB, Lyme, HIV were negative.  She  is varicella immune (1491).  Vitamin D was mildly reduced.  REVIEW OF SYSTEMS: Constitutional: No fevers, chills, sweats, or change in appetite Eyes: No visual changes, double vision, eye pain Ear, nose and throat: No hearing loss, ear pain, nasal congestion, sore throat Cardiovascular: No chest pain, palpitations Respiratory: No shortness of breath at rest or with exertion.   No wheezes GastrointestinaI: No nausea, vomiting, diarrhea, abdominal pain, fecal incontinence Genitourinary: No dysuria, urinary retention or frequency.  No nocturia. Musculoskeletal: No neck pain,  back pain Integumentary: No rash, pruritus, skin lesions Neurological: as above Psychiatric: No depression at this time.  No anxiety Endocrine: No palpitations, diaphoresis, change in appetite, change in weigh or increased thirst Hematologic/Lymphatic: No anemia, purpura, petechiae. Allergic/Immunologic: No itchy/runny eyes, nasal congestion, recent allergic reactions, rashes  ALLERGIES: No Known Allergies  HOME MEDICATIONS:  Current Outpatient Medications:  .  Cladribine, 10 Tabs, (MAVENCLAD, 10 TABS,) 10 MG TBPK, Take by mouth. Month 1: Days 1-5: 2 tabs per day=total 10 tabs  Month 2: Days 1-5: 2 tabs per day=total 10 tabs, Disp: , Rfl:  .  VITAMIN D PO, Take 1 Dose by mouth daily., Disp: , Rfl:  .  rizatriptan (MAXALT-MLT) 10 MG disintegrating tablet, Take 1 tablet (10 mg total) by mouth as needed for migraine. May repeat in 2 hours if needed, Disp: 9 tablet, Rfl: 11  PAST MEDICAL HISTORY: Past Medical History:  Diagnosis Date  . Incoordination     PAST SURGICAL HISTORY: Past Surgical History:  Procedure Laterality Date  . LASIK Bilateral 2017  . REFRACTIVE SURGERY Bilateral     FAMILY HISTORY: Family History  Problem Relation Age of Onset  . Thyroid disease Mother   . Diabetes Father   . Thyroid disease Brother     SOCIAL HISTORY:  Social History   Socioeconomic History  . Marital status: Single    Spouse name: Not on file  . Number of children: 0  . Years of education: college  . Highest education level: Not on file  Occupational History  . Occupation: Emergency planning/management officer  Tobacco Use  . Smoking status: Never Smoker  . Smokeless tobacco: Never Used  Vaping Use  . Vaping Use: Never used  Substance and Sexual Activity  . Alcohol use: Yes    Comment: very little  . Drug use: Never  . Sexual activity: Not on file  Other Topics Concern  . Not on file  Social History Narrative   Lives at home with boyfriend.   Right-handed.   Drinks coffee, with extra  espresso, in the mornings.  Occasionally uses caffeinated energy powder. (about 600mg  per day)   She works as a in [place].   Social Determinants of Health   Financial Resource Strain: Not on file  Food Insecurity: Not on file  Transportation Needs: Not on file  Physical Activity: Not on file  Stress: Not on file  Social Connections: Not on file  Intimate Partner Violence: Not on file     PHYSICAL EXAM  Vitals:   06/02/20 1242  BP: 121/82  Pulse: 81  SpO2: 98%  Weight: 250 lb 8 oz (113.6 kg)  Height: 5' 6.5" (1.689 m)    Body mass index is 39.83 kg/m.   General: The patient is well-developed and well-nourished and in no acute distress  HEENT:  Head is [place]/AT.  Sclera are anicteric.    Skin: Extremities are without rash or  edema.  Neurologic Exam  Mental status: The patient is alert  and oriented x 3 at the time of the examination. The patient has apparent normal recent and remote memory, with an apparently normal attention span and concentration ability.   Speech is normal.  Cranial nerves: Extraocular movements are full.  Facial symmetry is present. There is good facial sensation to soft touch bilaterally.Facial strength is normal.  Trapezius and sternocleidomastoid strength is normal. No dysarthria is noted.   No obvious hearing deficits are noted.  Motor:  Muscle bulk is normal.   Tone is normal. Strength is  5 / 5 in all 4 extremities.   Sensory: Intact touch and vibration sensation.  Coordination: Cerebellar testing reveals good finger-nose-finger and heel-to-shin bilaterally.  Gait and station: Station is normal.   Gait is normal. Tandem gait is normal. Romberg is negative.   Reflexes: Deep tendon reflexes are symmetric and normal bilaterally.        DIAGNOSTIC DATA (LABS, IMAGING, TESTING) - I reviewed patient records, labs, notes, testing and imaging myself where available.  Lab Results  Component Value Date   WBC 9.7 02/12/2020    HGB 14.3 02/12/2020   HCT 43.0 02/12/2020   MCV 91 02/12/2020   PLT 305 02/12/2020      Component Value Date/Time   NA 140 02/12/2020 1432   K 4.0 02/12/2020 1432   CL 102 02/12/2020 1432   CO2 22 02/12/2020 1432   GLUCOSE 84 02/12/2020 1432   BUN 9 02/12/2020 1432   CREATININE 0.74 02/12/2020 1432   CALCIUM 9.4 02/12/2020 1432   PROT 7.4 02/12/2020 1432   ALBUMIN 4.8 02/12/2020 1432   ALBUMIN 4.8 03/13/2019 0909   AST 14 02/12/2020 1432   ALT 11 02/12/2020 1432   ALKPHOS 90 02/12/2020 1432   BILITOT <0.2 02/12/2020 1432   GFRNONAA 112 02/12/2020 1432   GFRAA 129 02/12/2020 1432   No results found for: CHOL, HDL, LDLCALC, LDLDIRECT, TRIG, CHOLHDL No results found for: ITGP4D Lab Results  Component Value Date   VITAMINB12 827 02/26/2019   Lab Results  Component Value Date   TSH 0.38 10/30/2019       ASSESSMENT AND PLAN  Multiple sclerosis, relapsing-remitting (HCC) - Plan: CBC with Differential/Platelet, Hepatic function panel, Thyroid Panel With TSH  High risk medication use - Plan: CBC with Differential/Platelet, Hepatic function panel, Thyroid Panel With TSH  Migraine without aura and without status migrainosus, not intractable  Abnormal thyroid blood test  1.   She did the second year Mavenclad January/February.   Check LFT and CBC with differential today.   Will need CBC/D and LFT checked again in 4 months. 2.    Use anti-fungal cream to armpit for probable fungal rash 3.    Maxalt for migraine  4.    Recheck TSH/T4 5.   She will return in 4  months or sooner if there are new or worsening neurologic symptoms.   Visit after that can be in 6 months.     Tianna Baus A. Epimenio Foot, MD, [facility] 06/02/2020, 1:36 PM Certified in Neurology, Clinical Neurophysiology, Sleep Medicine and Neuroimaging  [facility] [address] [place], [place] [address] [phone]

## 2020-06-03 LAB — HEPATIC FUNCTION PANEL
ALT: 8 IU/L (ref 0–32)
AST: 15 IU/L (ref 0–40)
Albumin: 4.8 g/dL (ref 3.9–5.0)
Alkaline Phosphatase: 80 IU/L (ref 44–121)
Bilirubin Total: 0.2 mg/dL (ref 0.0–1.2)
Bilirubin, Direct: <0.1 mg/dL (ref 0.00–0.40)
Total Protein: 7.1 g/dL (ref 6.0–8.5)

## 2020-06-03 LAB — CBC WITH DIFFERENTIAL/PLATELET
Basophils Absolute: 0 10*3/uL (ref 0.0–0.2)
Basos: 1 %
EOS (ABSOLUTE): 0.3 10*3/uL (ref 0.0–0.4)
Eos: 4 %
Hematocrit: 43.5 % (ref 34.0–46.6)
Hemoglobin: 14.7 g/dL (ref 11.1–15.9)
Immature Grans (Abs): 0 10*3/uL (ref 0.0–0.1)
Immature Granulocytes: 0 %
Lymphocytes Absolute: 0.6 10*3/uL — ABNORMAL LOW (ref 0.7–3.1)
Lymphs: 8 %
MCH: 31.7 pg (ref 26.6–33.0)
MCHC: 33.8 g/dL (ref 31.5–35.7)
MCV: 94 fL (ref 79–97)
Monocytes Absolute: 0.9 10*3/uL (ref 0.1–0.9)
Monocytes: 12 %
Neutrophils Absolute: 5.3 10*3/uL (ref 1.4–7.0)
Neutrophils: 75 %
Platelets: 262 10*3/uL (ref 150–450)
RBC: 4.63 x10E6/uL (ref 3.77–5.28)
RDW: 13.1 % (ref 11.7–15.4)
WBC: 7.1 10*3/uL (ref 3.4–10.8)

## 2020-06-03 LAB — THYROID PANEL WITH TSH
Free Thyroxine Index: 2.7 (ref 1.2–4.9)
T3 Uptake Ratio: 26 % (ref 24–39)
T4, Total: 10.4 ug/dL (ref 4.5–12.0)
TSH: 0.758 u[IU]/mL (ref 0.450–4.500)

## 2020-06-23 ENCOUNTER — Telehealth: Payer: Self-pay | Admitting: Neurology

## 2020-06-23 MED ORDER — GABAPENTIN 300 MG PO CAPS: 300.0000 mg | ORAL_CAPSULE | Freq: Three times a day (TID) | ORAL | 3 refills | Status: DC

## 2020-06-23 NOTE — Telephone Encounter (Signed)
Called pt back. She went to urgent care today and was diagnosed w/ shingles on L buttocks. Her sx started this past Sunday where she felt like she had a sunburn on L buttocks, then rash started yesterday. Prescribed vacyclovir 1G, directions: take three times daily x14 days. Was not given gabapentin. They instructed her to call neurologist to get approval for this.

## 2020-06-23 NOTE — Telephone Encounter (Addendum)
Spoke with Dr. Epimenio Foot. Reviewed pt chart/meds. Ok to prescribe Gabapentin 300mg  po TID. If she tolerates this well but needs higher dose in the future, can increase to 600mg  po TID.  I called pt and relayed Dr. recommendation. She is agreeable to try this. I e-scribed to pharmacy. She will call if she has any more questions/concerns.

## 2020-06-23 NOTE — Telephone Encounter (Signed)
Pt called and LVM requesting a call back from the RN. She would like to discuss several questions she has for her. No other information was left on the VM. Please advise.

## 2020-09-12 IMAGING — CR DG CHEST 2V
2 series · 2 of 2 positions shown · non-contrast
Comparison: None.

CLINICAL DATA: Elevated white blood cell count.  History of MS.

EXAM:
CHEST - 2 VIEW

[w chest pa]
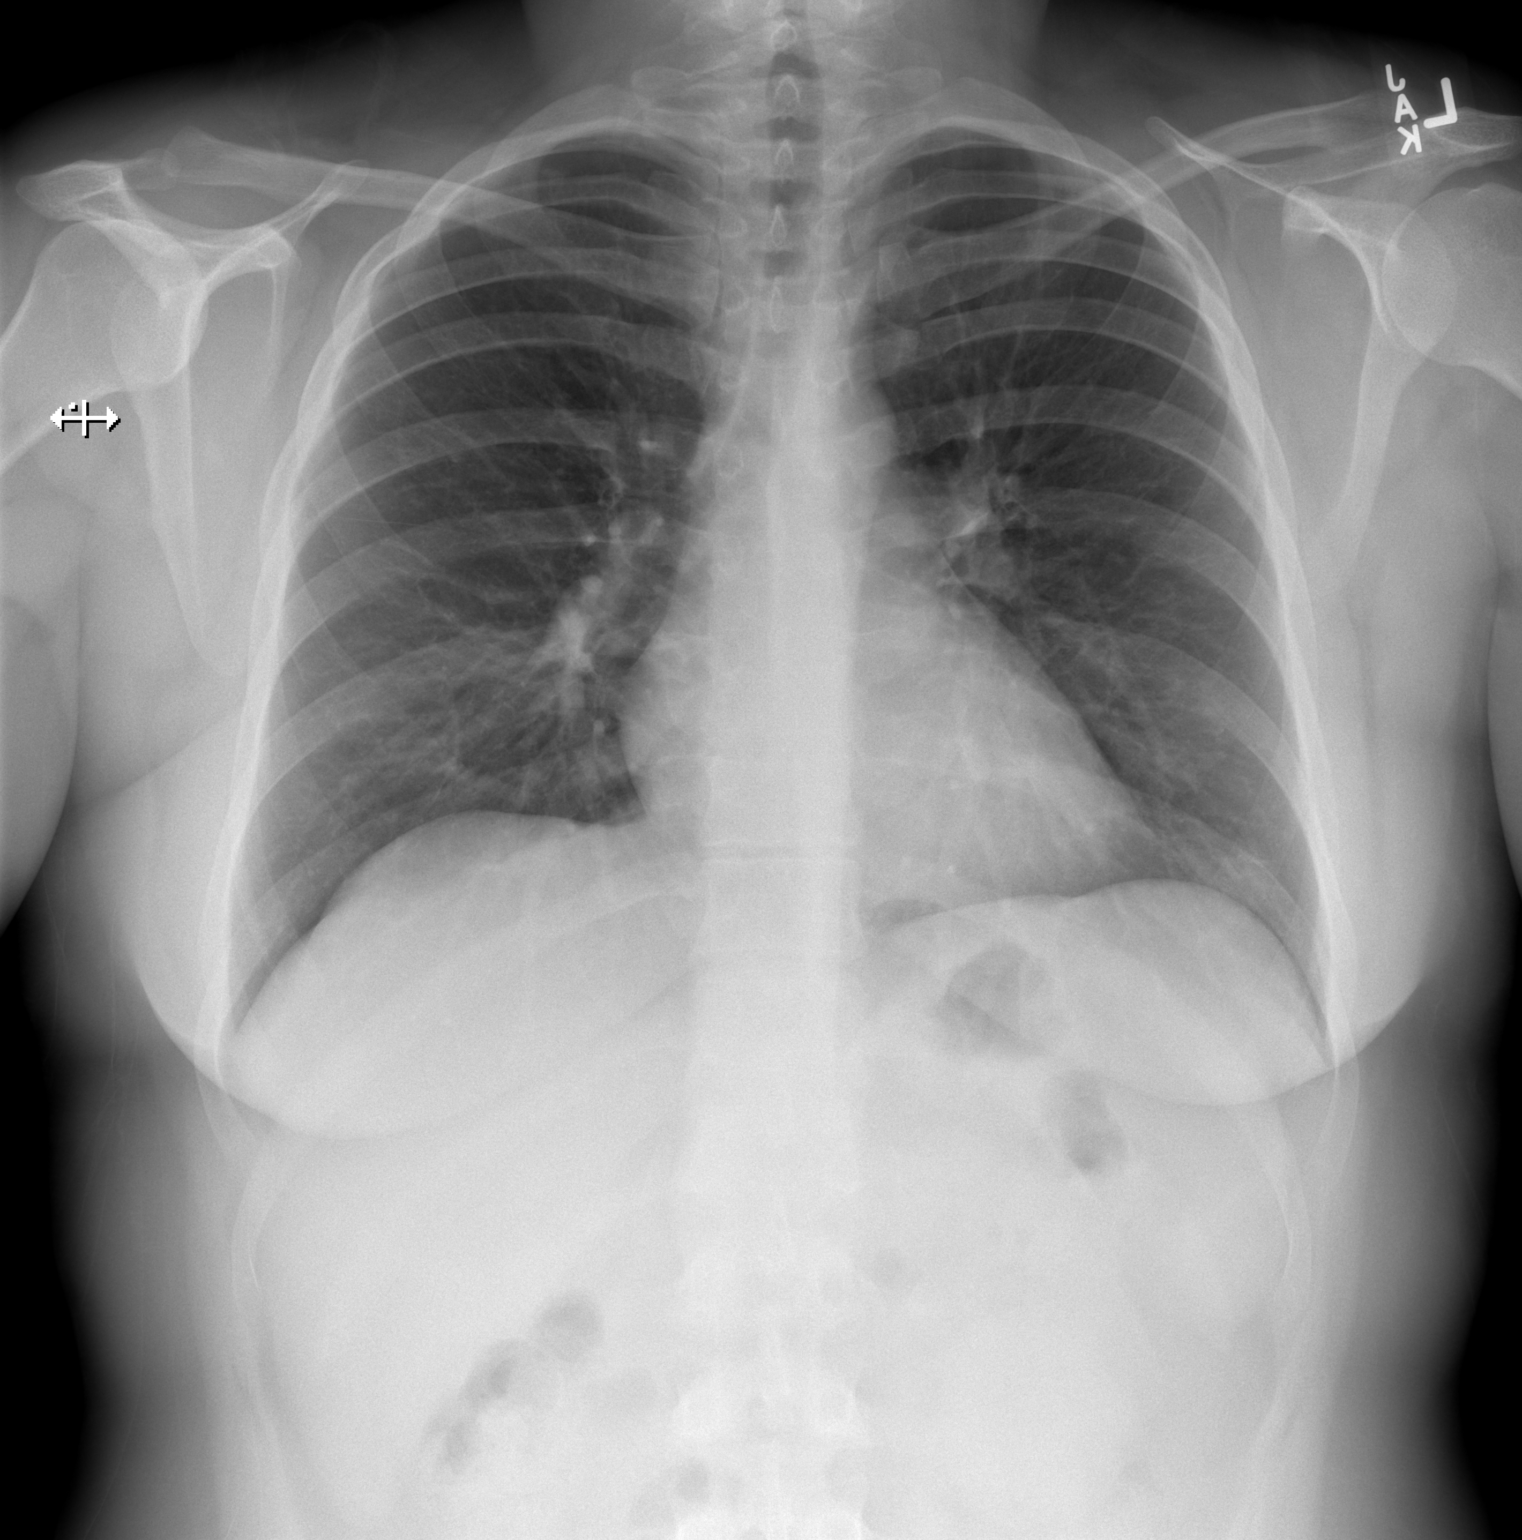

[w chest lat]
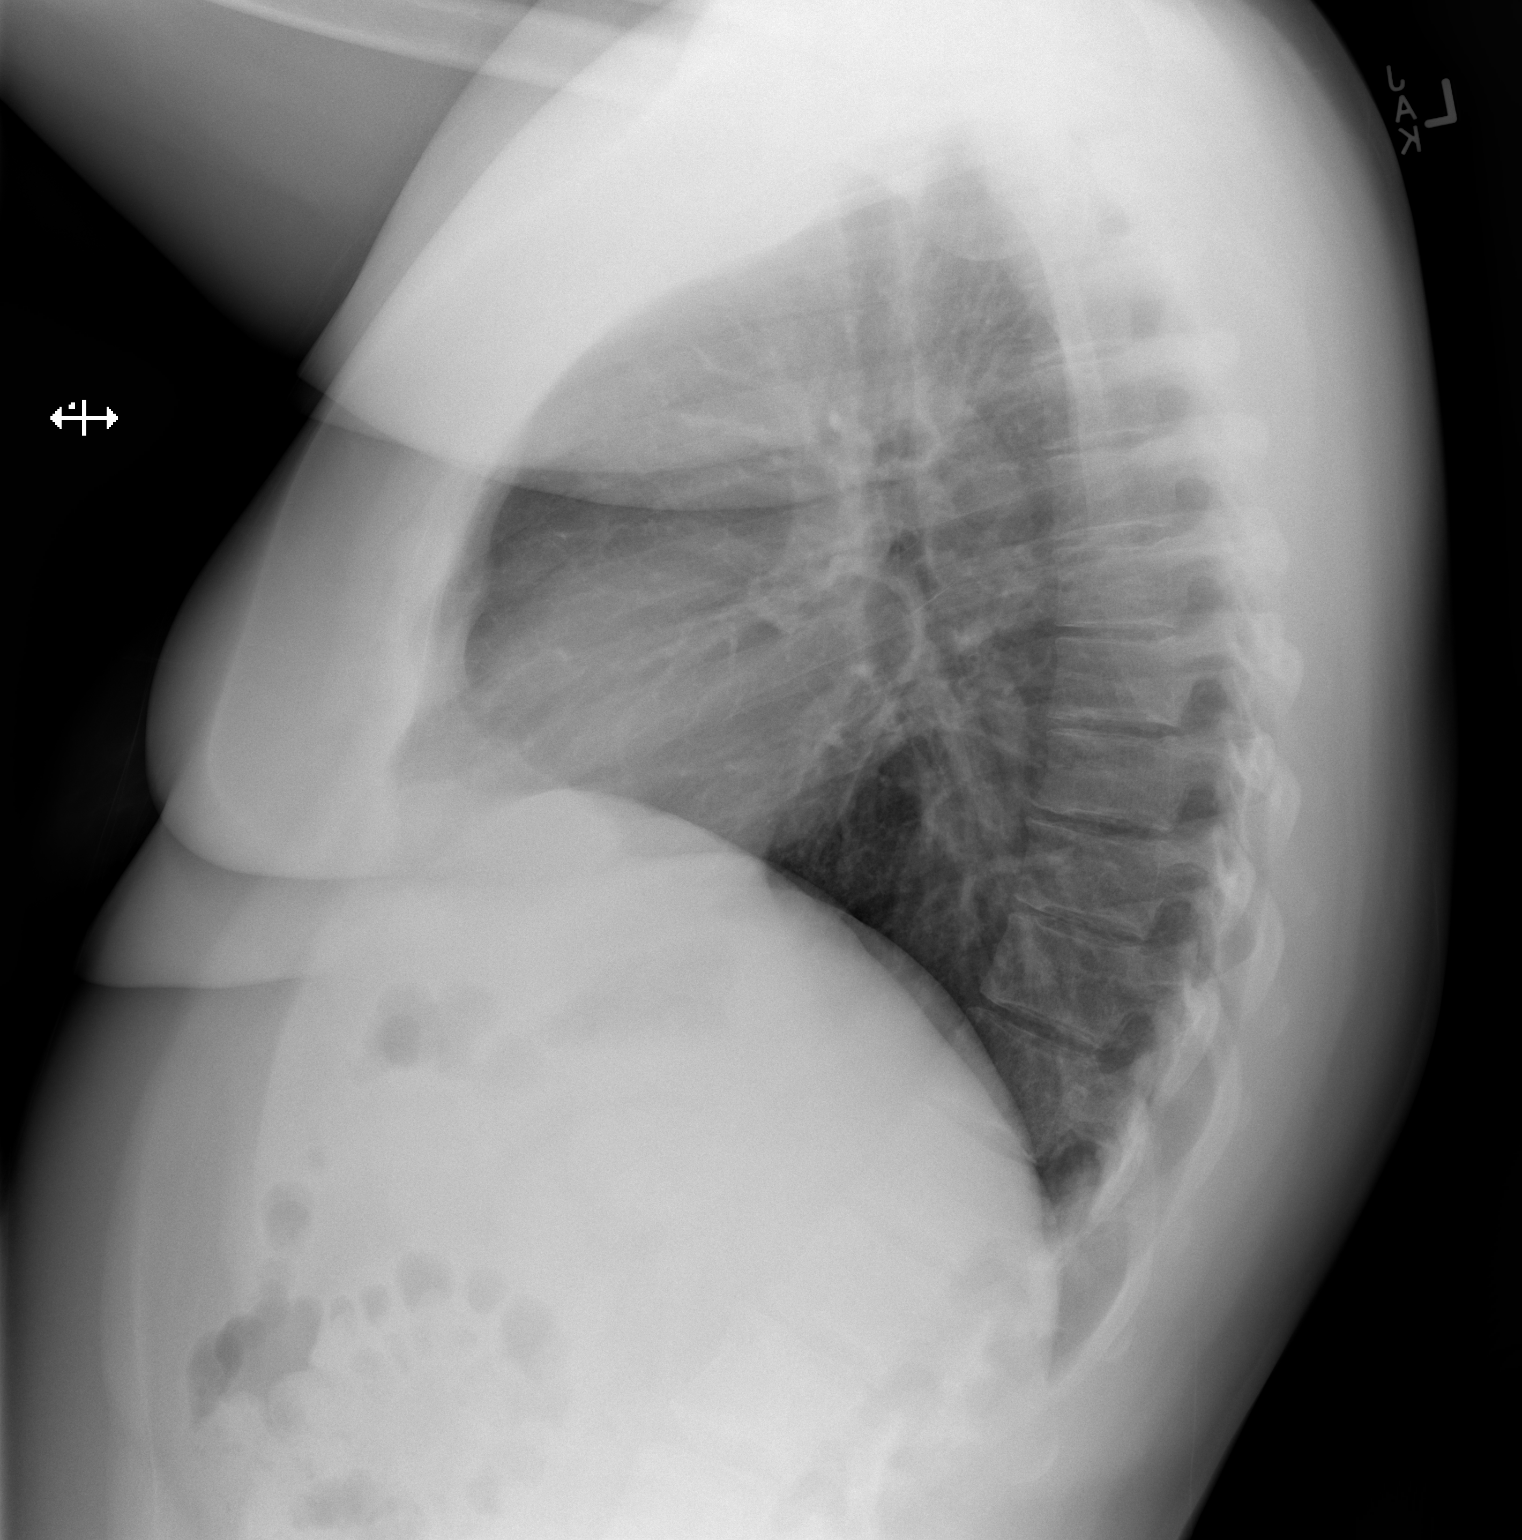

[2 of 2 positions shown; findings below may reference images not displayed]

FINDINGS: Mediastinum and hilar structures normal. Lungs are clear. No pleural
effusion or pneumothorax. No acute bony abnormality.
IMPRESSION: No acute cardiopulmonary disease.

## 2020-10-13 NOTE — Progress Notes (Addendum)
Chief Complaint  Patient presents with   Follow-up    Pt alone, rm 1. Pt states overall stable. She has completed Mavenclad course     HISTORY OF PRESENT ILLNESS: 10/14/20 ALL:  Julie Wagner is a 25 y.o. female here today for follow up for MS. She finished second round of Mavenclad in February. Last lymph count 0.6.   She is doing very well. She is working full time. 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 She denies any concerns of weakness or gait changes.   She was started on gabapentin post shingles infection. She is not certain it helped. She is no longer taking.   Migraines are well managed. She may have 3-4 headaches one month and none for 1-2 months. She has used rizatriptan once since last being seen and reports this was during a viral infection. It did seem to help.   She is sleeping well. Her mood is good. She does feel that she stutters from time to time but states that her husband does as well and has Tourette's.    HISTORY (copied from Dr Bonnita Hollow previous note) 06/02/2020 RS: Julie Wagner is a 25 y.o. woman with relapsoing remitting multiple sclerosis.     Update 06/02/2020 She did the second year of Mavenclad January and February 2022.    She felt some aching inher hands and she continues to experience this.   She also had headaches.   She had allergic like issues but has noted some discoloration and itching in the right axilla.       Her MS seems stable.   She can walk unlimited (same as others) and she can go downstairs without the bannister.   Balance is fine.  She has no weakness but sometimes has tingling in her fingers and feet - less than in the past.   Vision is fine.   She has some urinary urgency but this is a long time issue.     She has migraines 2-4 times a month generally but it was daily during the [facility].   Maxalt has helped   She notes mild fatigue but not much different than before MS.    She sleeps well  most nights.   No new mood issues, Cognition is fine.       She has migraine headaches several times .    TSH was borderline low in 2021.  Will recheck to assess for hyperthyroidism.   02/12/2020 RS:  I had the pleasure seeing your patient, Julie Wagner, at the MS center [facility] for neurologic consultation regarding her multiple sclerosis.   She is a 25 year old woman who was diagnosed with MS January 2021 after presenting with left-sided weakness.  She was started on Mavenclad and has completed the first year and will be due for her second year in about a month.    She had the first cycle of Mavenclad January and February 2021.  She had some headaches and nausea for a few days each course but nothing severe.   She had no rash or itching.  She has not had any exacerbation or new neurologic symptom since starting.   Currently, she feels baseline and notes no difficulty with gait, balance, strength or sensation.   She denies change in bladder function (had baseline urinary frequency).  Vision is fine.   She notes mild fatigue but not much different than before MS.   She woks a  late shift - typically 4 pm to 3 am.    She sleeps well most nights.   She has depression and anxiety --- the anxiety is worse this year but the depression is the same.    She noted some mild cognitive issues last January but is back to baseline now.       She has migraine headaches several times    MS history: She had the onset of left sided weakness in January 2021.    An MRI of the brain showed a large tumefactive lesion in the right frontal lobe associated with mass-effect and 5 other enhancing lesions that were smaller.  She also had a lumbar puncture performed a week later showing oligoclonal bands.  MRI of the spine did not show any MS lesions.  Her left-sided symptoms improved over time.   She was started on [facility] and took the first course in late January in late February.   Imaging: MRI of  the brain 03/06/2019 showed a large heterogenous rim-enhancing lesion in the right frontal lobe associated with mass-effect.  There are also 5 other enhancing lesions that are smaller and several other T2/FLAIR hyperintense foci that do not enhance.  The combination of enhancing and nonenhancing lesions is consistent with a diagnosis of multiple sclerosis.  The large focus likely represents a tumefactive lesion while the other enhancing lesions are more typical for MS.   MRI of the cervical and thoracic spine 04/12/2019 did not show any lesions within the spinal cord.   Labs: CSF 03/13/2019 showed at least 5 oligoclonal bands.  Additionally the IgG index was elevated at 1.34.   Labs 03/07/2019: Hepatitis B, TB, Lyme, HIV were negative.  She is varicella immune (1491).  Vitamin D was mildly reduced.   REVIEW OF SYSTEMS: Out of a complete 14 system review of symptoms, the patient complains only of the following symptoms, headaches and all other reviewed systems are negative.   ALLERGIES: No Known Allergies   HOME MEDICATIONS: Outpatient Medications Prior to Visit  Medication Sig Dispense Refill   Cladribine, 10 Tabs, (MAVENCLAD, 10 TABS,) 10 MG TBPK Take by mouth. Month 1: Days 1-5: 2 tabs per day=total 10 tabs  Month 2: Days 1-5: 2 tabs per day=total 10 tabs     liothyronine (CYTOMEL) 5 MCG tablet Take 10 mcg by mouth daily.     rizatriptan (MAXALT-MLT) 10 MG disintegrating tablet Take 1 tablet (10 mg total) by mouth as needed for migraine. May repeat in 2 hours if needed 9 tablet 11   VITAMIN D PO Take 1 Dose by mouth daily.     gabapentin (NEURONTIN) 300 MG capsule Take 1 capsule (300 mg total) by mouth 3 (three) times daily. 90 capsule 3   No facility-administered medications prior to visit.     PAST MEDICAL HISTORY: Past Medical History:  Diagnosis Date   Incoordination      PAST SURGICAL HISTORY: Past Surgical History:  Procedure Laterality Date   LASIK Bilateral 2017    REFRACTIVE SURGERY Bilateral      FAMILY HISTORY: Family History  Problem Relation Age of Onset   Thyroid disease Mother    Diabetes Father    Thyroid disease Brother      SOCIAL HISTORY: Social History   Socioeconomic History   Marital status: Single    Spouse name: Not on file   Number of children: 0   Years of education: college   Highest education level: Not on file  Occupational History  Occupation: Emergency planning/management officer  Tobacco Use   Smoking status: Never   Smokeless tobacco: Never  Vaping Use   Vaping Use: Never used  Substance and Sexual Activity   Alcohol use: Yes    Comment: very little   Drug use: Never   Sexual activity: Not on file  Other Topics Concern   Not on file  Social History Narrative   Lives at home with boyfriend.   Right-handed.   Drinks coffee, with extra espresso, in the mornings.  Occasionally uses caffeinated energy powder. (about 600mg  per day)   She works as a in [place].   Social Determinants of Health   Financial Resource Strain: Not on file  Food Insecurity: Not on file  Transportation Needs: Not on file  Physical Activity: Not on file  Stress: Not on file  Social Connections: Not on file  Intimate Partner Violence: Not on file     PHYSICAL EXAM  Vitals:   10/14/20 0750  BP: 122/82  Pulse: 79  Weight: 249 lb (112.9 kg)  Height: 5\' 7"  (1.702 m)   Body mass index is 39 kg/m.   Generalized: Well developed, in no acute distress  Cardiology: normal rate and rhythm, no murmur auscultated  Respiratory: clear to auscultation bilaterally    Neurological examination  Mentation: Alert oriented to time, place, history taking. Follows all commands speech and language fluent Cranial nerve II-XII: Pupils were equal round reactive to light. Extraocular movements were full, visual field were full on confrontational test. Facial sensation and strength were normal. Uvula tongue midline. Head turning and shoulder  shrug  were normal and symmetric. Motor: The motor testing reveals 5 over 5 strength of all 4 extremities. Good symmetric motor tone is noted throughout.  Sensory: Sensory testing is intact to soft touch on all 4 extremities. No evidence of extinction is noted.  Coordination: Cerebellar testing reveals good finger-nose-finger and heel-to-shin bilaterally.  Gait and station: Gait is normal. Tandem gait is normal. Romberg is negative. No drift is seen.  Reflexes: Deep tendon reflexes are symmetric and normal bilaterally.    DIAGNOSTIC DATA (LABS, IMAGING, TESTING) - I reviewed patient records, labs, notes, testing and imaging myself where available.  Lab Results  Component Value Date   WBC 7.1 06/02/2020   HGB 14.7 06/02/2020   HCT 43.5 06/02/2020   MCV 94 06/02/2020   PLT 262 06/02/2020      Component Value Date/Time   NA 140 02/12/2020 1432   K 4.0 02/12/2020 1432   CL 102 02/12/2020 1432   CO2 22 02/12/2020 1432   GLUCOSE 84 02/12/2020 1432   BUN 9 02/12/2020 1432   CREATININE 0.74 02/12/2020 1432   CALCIUM 9.4 02/12/2020 1432   PROT 7.1 06/02/2020 1339   ALBUMIN 4.8 06/02/2020 1339   ALBUMIN 4.8 03/13/2019 0909   AST 15 06/02/2020 1339   ALT 8 06/02/2020 1339   ALKPHOS 80 06/02/2020 1339   BILITOT 0.2 06/02/2020 1339   GFRNONAA 112 02/12/2020 1432   GFRAA 129 02/12/2020 1432   No results found for: CHOL, HDL, LDLCALC, LDLDIRECT, TRIG, CHOLHDL No results found for: 02/14/2020 Lab Results  Component Value Date   VITAMINB12 827 02/26/2019   Lab Results  Component Value Date   TSH 0.758 06/02/2020    No flowsheet data found.   No flowsheet data found.   ASSESSMENT AND PLAN  25 y.o. year old female  has a past medical history of Incoordination. here with    Multiple sclerosis,  relapsing-remitting (HCC)  High risk medication use  Migraine without aura and without status migrainosus, not intractable  Derenda continues to do very well. We will update labs,  today. She will continue rizatriptan as needed. She will focus on healthy lifestyle habits. She will follow up with Dr Epimenio Foot in 6 months, sooner if needed. She verbalizes understanding and agreement with this plan.    No orders of the defined types were placed in this encounter.    No orders of the defined types were placed in this encounter.     Shawnie Dapper, MSN, FNP-C 10/14/2020, 7:55 AM  [facility] [address] [place], [place] [address] [phone]    I have read the note, and I agree with the clinical assessment and plan.  Richard A. Epimenio Foot, MD, PhD, [facility] Certified in Neurology, Clinical Neurophysiology, Sleep Medicine, Pain Medicine and Neuroimaging  [facility] [address] [place], [place] [address] [phone]

## 2020-10-13 NOTE — Patient Instructions (Signed)
Below is our plan:  We will continue to monitor. Use rizatriptan as needed.   Please make sure you are staying well hydrated. I recommend 50-60 ounces daily. Well balanced diet and regular exercise encouraged. Consistent sleep schedule with 6-8 hours recommended.   Please continue follow up with care team as directed.   Follow up with Dr Epimenio Foot  in 6 months   You may receive a survey regarding today's visit. I encourage you to leave honest feed back as I do use this information to improve patient care. Thank you for seeing me today!

## 2020-10-14 ENCOUNTER — Encounter: Payer: Self-pay | Admitting: Family Medicine

## 2020-10-14 ENCOUNTER — Ambulatory Visit: Payer: 59 | Admitting: Family Medicine

## 2020-10-14 VITALS — BP 122/82 | HR 79 | Ht 67.0 in | Wt 249.0 lb

## 2020-10-14 DIAGNOSIS — G35 Multiple sclerosis: Secondary | ICD-10-CM | POA: Diagnosis not present

## 2020-10-14 DIAGNOSIS — Z79899 Other long term (current) drug therapy: Secondary | ICD-10-CM | POA: Diagnosis not present

## 2020-10-14 DIAGNOSIS — G35A Relapsing-remitting multiple sclerosis: Secondary | ICD-10-CM

## 2020-10-14 DIAGNOSIS — G43009 Migraine without aura, not intractable, without status migrainosus: Secondary | ICD-10-CM

## 2020-10-15 LAB — CBC WITH DIFFERENTIAL/PLATELET
Basophils Absolute: 0.1 10*3/uL (ref 0.0–0.2)
Basos: 1 %
EOS (ABSOLUTE): 0.3 10*3/uL (ref 0.0–0.4)
Eos: 4 %
Hematocrit: 39 % (ref 34.0–46.6)
Hemoglobin: 13.1 g/dL (ref 11.1–15.9)
Immature Grans (Abs): 0 10*3/uL (ref 0.0–0.1)
Immature Granulocytes: 0 %
Lymphocytes Absolute: 1.5 10*3/uL (ref 0.7–3.1)
Lymphs: 18 %
MCH: 31 pg (ref 26.6–33.0)
MCHC: 33.6 g/dL (ref 31.5–35.7)
MCV: 92 fL (ref 79–97)
Monocytes Absolute: 1.1 10*3/uL — ABNORMAL HIGH (ref 0.1–0.9)
Monocytes: 13 %
Neutrophils Absolute: 5.4 10*3/uL (ref 1.4–7.0)
Neutrophils: 64 %
Platelets: 267 10*3/uL (ref 150–450)
RBC: 4.23 x10E6/uL (ref 3.77–5.28)
RDW: 12.3 % (ref 11.7–15.4)
WBC: 8.3 10*3/uL (ref 3.4–10.8)

## 2020-10-15 LAB — COMPREHENSIVE METABOLIC PANEL
ALT: 15 IU/L (ref 0–32)
AST: 25 IU/L (ref 0–40)
Albumin/Globulin Ratio: 1.9 (ref 1.2–2.2)
Albumin: 4.3 g/dL (ref 3.9–5.0)
Alkaline Phosphatase: 84 IU/L (ref 44–121)
BUN/Creatinine Ratio: 13 (ref 9–23)
BUN: 12 mg/dL (ref 6–20)
Bilirubin Total: <0.2 mg/dL (ref 0.0–1.2)
CO2: 20 mmol/L (ref 20–29)
Calcium: 9.5 mg/dL (ref 8.7–10.2)
Chloride: 105 mmol/L (ref 96–106)
Creatinine, Ser: 0.9 mg/dL (ref 0.57–1.00)
Globulin, Total: 2.3 g/dL (ref 1.5–4.5)
Glucose: 88 mg/dL (ref 65–99)
Potassium: 4 mmol/L (ref 3.5–5.2)
Sodium: 140 mmol/L (ref 134–144)
Total Protein: 6.6 g/dL (ref 6.0–8.5)
eGFR: 90 mL/min/{1.73_m2} (ref >59)

## 2021-04-20 ENCOUNTER — Ambulatory Visit: Payer: 59 | Admitting: Neurology

## 2021-04-20 ENCOUNTER — Encounter: Payer: Self-pay | Admitting: Neurology

## 2021-04-20 VITALS — BP 138/82 | HR 81 | Ht 67.0 in | Wt 256.0 lb

## 2021-04-20 DIAGNOSIS — R531 Weakness: Secondary | ICD-10-CM

## 2021-04-20 DIAGNOSIS — G35 Multiple sclerosis: Secondary | ICD-10-CM | POA: Diagnosis not present

## 2021-04-20 DIAGNOSIS — R5383 Other fatigue: Secondary | ICD-10-CM

## 2021-04-20 MED ORDER — MODAFINIL 200 MG PO TABS: 200.0000 mg | ORAL_TABLET | Freq: Every day | ORAL | 1 refills | Status: DC

## 2021-04-20 NOTE — Progress Notes (Signed)
[facility]  PATIENT: Julie Wagner DOB: 1996/01/21  REFERRING DOCTOR OR PCP: Levert Feinstein MD SOURCE: Patient, notes from Dr. Kennedy Bucker, lab reports, imaging results, MRI images personally reviewed.  _________________________________   HISTORICAL  CHIEF COMPLAINT:  Chief Complaint  Patient presents with   Follow-up    RM 10, alone. MS DMT: Mavenclad.     HISTORY OF PRESENT ILLNESS:  Julie Wagner is a 26 y.o. woman with relapsoing remitting multiple sclerosis.  Update 04/20/2021 She did the second year of Mavenclad January and February 2022.        Lymphocyte nadir secod year was 0.6 and was 1.5 (normal) 09/2020.     She notes hand pain pain.  Activity increases the pain.     She has painful intercourse which has hapeed in past even before MS diagnosis.     Her MS is otherwise stable.   She can walk unlimited (same as others) and she can go downstairs without the bannister.   Balance is fine.  She has no weakness but sometimes has tingling in her fingers and feet - less than in the past.   Vision is fine.   She has some urinary urgency but this is a long time issue.    She has migraines 2-4 times a month generally but it was daily during the [facility].   Maxalt has helped  She notes more  fatigue and is sleepy during the day.   She does snore but has never been noted to have gasping or snorts or other OSA signs.    She sleeps well most nights.   No new mood issues -- some anxiety, better than 2 years ago.   Cognition is fine.       She has migraine headaches occasioanlly but these are tolerable.      MS history: She had the onset of left sided weakness in January 2021.    An MRI of the brain showed a large tumefactive lesion in the right frontal lobe associated with mass-effect and 5 other enhancing lesions that were smaller.  She also had a lumbar puncture performed a week later showing oligoclonal bands.  MRI of the spine did not show any MS lesions.  Her  left-sided symptoms improved over time.   She was started on [facility] and took the first course in late January in late February 2021 and second year Jan/Feb 2022.    Imaging: MRI of the brain 03/06/2019 showed a large heterogenous rim-enhancing lesion in the right frontal lobe associated with mass-effect.  There are also 5 other enhancing lesions that are smaller and several other T2/FLAIR hyperintense foci that do not enhance.  The combination of enhancing and nonenhancing lesions is consistent with a diagnosis of multiple sclerosis.  The large focus likely represents a tumefactive lesion while the other enhancing lesions are more typical for MS.  MRI of the brain 02/18/2020 showed no new lesoins and resolution of the enhancement seen on previous MRI.    MRI of the cervical and thoracic spine 04/12/2019 did not show any lesions within the spinal cord.  Labs: CSF 03/13/2019 showed at least 5 oligoclonal bands.  Additionally the IgG index was elevated at 1.34.  Labs 03/07/2019: Hepatitis B, TB, Lyme, HIV were negative.  She is varicella immune (1491).  Vitamin D was mildly reduced.  REVIEW OF SYSTEMS: Constitutional: No fevers, chills, sweats, or change in appetite Eyes: No visual changes, double vision, eye pain Ear, nose and throat: No hearing  loss, ear pain, nasal congestion, sore throat Cardiovascular: No chest pain, palpitations Respiratory:  No shortness of breath at rest or with exertion.   No wheezes GastrointestinaI: No nausea, vomiting, diarrhea, abdominal pain, fecal incontinence Genitourinary:  No dysuria, urinary retention or frequency.  No nocturia. Musculoskeletal:  No neck pain, back pain Integumentary: No rash, pruritus, skin lesions Neurological: as above Psychiatric: No depression at this time.  No anxiety Endocrine: No palpitations, diaphoresis, change in appetite, change in weigh or increased thirst Hematologic/Lymphatic:  No anemia, purpura,  petechiae. Allergic/Immunologic: No itchy/runny eyes, nasal congestion, recent allergic reactions, rashes  ALLERGIES: No Known Allergies  HOME MEDICATIONS:  Current Outpatient Medications:    Cladribine, 10 Tabs, (MAVENCLAD, 10 TABS,) 10 MG TBPK, Take by mouth. Month 1: Days 1-5: 2 tabs per day=total 10 tabs  Month 2: Days 1-5: 2 tabs per day=total 10 tabs, Disp: , Rfl:    modafinil (PROVIGIL) 200 MG tablet, Take 1 tablet (200 mg total) by mouth daily., Disp: 90 tablet, Rfl: 1   VITAMIN D PO, Take 200 mcg by mouth daily., Disp: , Rfl:   PAST MEDICAL HISTORY: Past Medical History:  Diagnosis Date   Incoordination     PAST SURGICAL HISTORY: Past Surgical History:  Procedure Laterality Date   LASIK Bilateral 2017   REFRACTIVE SURGERY Bilateral     FAMILY HISTORY: Family History  Problem Relation Age of Onset   Thyroid disease Mother    Diabetes Father    Thyroid disease Brother     SOCIAL HISTORY:  Social History   Socioeconomic History   Marital status: Single    Spouse name: Not on file   Number of children: 0   Years of education: college   Highest education level: Not on file  Occupational History   Occupation: Emergency planning/management officer  Tobacco Use   Smoking status: Never   Smokeless tobacco: Never  Vaping Use   Vaping Use: Never used  Substance and Sexual Activity   Alcohol use: Yes    Comment: very little   Drug use: Never   Sexual activity: Not on file  Other Topics Concern   Not on file  Social History Narrative   Lives at home with boyfriend.   Right-handed.   Drinks coffee, with extra espresso, in the mornings.  Occasionally uses caffeinated energy powder. (about 600mg  per day)   She works as a in [place].   Social Determinants of Health   Financial Resource Strain: Not on file  Food Insecurity: Not on file  Transportation Needs: Not on file  Physical Activity: Not on file  Stress: Not on file  Social Connections: Not on file   Intimate Partner Violence: Not on file     PHYSICAL EXAM  Vitals:   04/20/21 1138  BP: 138/82  Pulse: 81  Weight: 256 lb (116.1 kg)  Height: 5\' 7"  (1.702 m)    Body mass index is 40.1 kg/m.   General: The patient is well-developed and well-nourished and in no acute distress  HEENT:  Head is [place]/AT.  Sclera are anicteric.    Skin: Extremities are without rash or  edema.  Neurologic Exam  Mental status: The patient is alert and oriented x 3 at the time of the examination. The patient has apparent normal recent and remote memory, with an apparently normal attention span and concentration ability.   Speech is normal.  Cranial nerves: Extraocular movements are full.  Facial symmetry is present. There is good facial sensation to  soft touch bilaterally.Facial strength is normal.  Trapezius and sternocleidomastoid strength is normal. No dysarthria is noted.   No obvious hearing deficits are noted.  Motor:  Muscle bulk is normal.   Tone is normal. Strength is  5 / 5 in all 4 extremities.   Sensory: N Phalen's or Tinels sings in arms.   Intact touch and vibration sensation.  Coordination: Cerebellar testing reveals good finger-nose-finger and heel-to-shin bilaterally.  Gait and station: Station is normal.   Gait is normal. Mildly wide  tandem walk.  Romberg is negative.   Reflexes: Deep tendon reflexes are symmetric and normal bilaterally.        DIAGNOSTIC DATA (LABS, IMAGING, TESTING) - I reviewed patient records, labs, notes, testing and imaging myself where available.  Lab Results  Component Value Date   WBC 8.3 10/14/2020   HGB 13.1 10/14/2020   HCT 39.0 10/14/2020   MCV 92 10/14/2020   PLT 267 10/14/2020      Component Value Date/Time   NA 140 10/14/2020 0825   K 4.0 10/14/2020 0825   CL 105 10/14/2020 0825   CO2 20 10/14/2020 0825   GLUCOSE 88 10/14/2020 0825   BUN 12 10/14/2020 0825   CREATININE 0.90 10/14/2020 0825   CALCIUM 9.5 10/14/2020 0825   PROT  6.6 10/14/2020 0825   ALBUMIN 4.3 10/14/2020 0825   ALBUMIN 4.8 03/13/2019 0909   AST 25 10/14/2020 0825   ALT 15 10/14/2020 0825   ALKPHOS 84 10/14/2020 0825   BILITOT <0.2 10/14/2020 0825   GFRNONAA 112 02/12/2020 1432   GFRAA 129 02/12/2020 1432   No results found for: CHOL, HDL, LDLCALC, LDLDIRECT, TRIG, CHOLHDL No results found for: BJSE8B Lab Results  Component Value Date   VITAMINB12 827 02/26/2019   Lab Results  Component Value Date   TSH 0.758 06/02/2020       ASSESSMENT AND PLAN  Multiple sclerosis, relapsing-remitting (HCC) - Plan: MR BRAIN W WO CONTRAST  Other fatigue  Left-sided weakness  1.   She did the second year Mavenclad January/February 2022 and lymphocytes were fine 6 months later.   No need for more blood work monitoring.    Check MRI of thebrain to determine if subclinical progression and start aother DMT if occurring.   2.     Provigil 200 mg daily.  She has MS related fatigue and sleepiness.   She has a Mirena (progesterone only IUD).   This should not iteract.  3.   If migraines worsen, retry Maxalt.   4.     She will return in 6  months or sooner if there are new or worsening neurologic symptoms.   Visit after that can be in 6 months.     Senai Kingsley A. Epimenio Foot, MD, [facility] 04/20/2021, 12:03 PM Certified in Neurology, Clinical Neurophysiology, Sleep Medicine and Neuroimaging  [facility] [address] [place], [place] [address] [phone]

## 2021-04-21 ENCOUNTER — Telehealth: Payer: Self-pay | Admitting: Neurology

## 2021-04-21 NOTE — Telephone Encounter (Signed)
[employer] auth: NPR via uhc website order sent to [facility], they will reach out to the patient to schedule.  ?

## 2021-04-22 ENCOUNTER — Telehealth: Payer: Self-pay | Admitting: *Deleted

## 2021-04-22 NOTE — Telephone Encounter (Signed)
Submitted PA modafinil on CMM. KeyOZ:[id number]. Waiting on determination from [facility].

## 2021-04-26 NOTE — Telephone Encounter (Signed)
Request Reference Number: [id number]. MODAFINIL TAB 200MG  is approved through 04/22/2022. Your patient may now fill this prescription and it will be covered.

## 2021-05-04 ENCOUNTER — Other Ambulatory Visit: Payer: 59

## 2021-05-07 ENCOUNTER — Other Ambulatory Visit: Payer: Self-pay

## 2021-05-07 ENCOUNTER — Ambulatory Visit
Admission: RE | Admit: 2021-05-07 | Discharge: 2021-05-07 | Disposition: A | Discharge status: Home / Self Care | Payer: 59 | Source: Ambulatory Visit | Attending: Neurology | Admitting: Neurology

## 2021-05-07 DIAGNOSIS — G35 Multiple sclerosis: Secondary | ICD-10-CM

## 2021-05-07 MED ORDER — GADOBENATE DIMEGLUMINE 529 MG/ML IV SOLN
20.0000 mL | Freq: Once | INTRAVENOUS | Status: AC | PRN
  Administered 2021-05-07: 20 mL via INTRAVENOUS

## 2021-09-06 ENCOUNTER — Telehealth: Payer: Self-pay | Admitting: Family Medicine

## 2021-09-06 NOTE — Telephone Encounter (Signed)
Reviewed pt chart. Last seen 04/20/21. Dr. Epimenio Foot wanted her back in 6 months. She has no f/u scheduled currently.  I called pt and scheduled f/u for 10/18/21 at 1pm with Dr. Epimenio Foot.  Checked drug registry. Last refilled 08/05/21 #30. Last rx refill sent 04/20/21 #90, 1 refill. Should have enough until 10/18/21.  Called [facility] at [phone]. Spoke w/ tech. She has 60 tabs left from last prescription sent. She has been getting partial fills. They will get rx ready for pt to pick up, nothing further needed.

## 2021-09-06 NOTE — Telephone Encounter (Signed)
Pt is calling to get a refill on modafinil (PROVIGIL) 200 MG tablet. Pt would like it Called in to [id number].

## 2021-10-18 ENCOUNTER — Ambulatory Visit: Payer: 59 | Admitting: Neurology

## 2021-10-18 ENCOUNTER — Encounter: Payer: Self-pay | Admitting: Neurology

## 2021-10-18 VITALS — BP 129/80 | HR 74 | Ht 67.0 in | Wt 253.5 lb

## 2021-10-18 DIAGNOSIS — G35 Multiple sclerosis: Secondary | ICD-10-CM | POA: Diagnosis not present

## 2021-10-18 DIAGNOSIS — G43009 Migraine without aura, not intractable, without status migrainosus: Secondary | ICD-10-CM

## 2021-10-18 DIAGNOSIS — R5383 Other fatigue: Secondary | ICD-10-CM

## 2021-10-18 DIAGNOSIS — F419 Anxiety disorder, unspecified: Secondary | ICD-10-CM | POA: Diagnosis not present

## 2021-10-18 MED ORDER — MODAFINIL 200 MG PO TABS: 200.0000 mg | ORAL_TABLET | Freq: Every day | ORAL | 1 refills | Status: DC

## 2021-10-18 MED ORDER — BUSPIRONE HCL 15 MG PO TABS: 15.0000 mg | ORAL_TABLET | Freq: Two times a day (BID) | ORAL | 5 refills | Status: DC

## 2021-10-18 MED ORDER — PHENTERMINE HCL 37.5 MG PO CAPS: 37.5000 mg | ORAL_CAPSULE | ORAL | 5 refills | Status: DC

## 2021-10-18 NOTE — Progress Notes (Signed)
[facility]  PATIENT: Julie Wagner DOB: [birth year]  REFERRING DOCTOR OR PCP: Marcial Pacas MD SOURCE: Patient, notes from Dr. Fatima Sanger, lab reports, imaging results, MRI images personally reviewed.  _________________________________   HISTORICAL  CHIEF COMPLAINT:  Chief Complaint  Patient presents with   Follow-up    Rm 1, alone. Here for 6 month MS f/u on Mavenclad. Pt main concern is feeling tired all the time.     HISTORY OF PRESENT ILLNESS:  Julie Wagner is a 26 y.o. woman with relapsoing remitting multiple sclerosis.  Update 04/20/2021 She did the second year of [place] January and February 2022.        Lymphocyte nadir secod year was 0.6 and was 1.5 (normal) 09/2020.     She feels her MS is mostly stable.  She has no difficulty with her gait or balance and goes downstairs without needing to use the bannister.    No numbness or weakness.    Bladder function ok - some urgency but no incontinence.   Vision is fine.   She recently saw ophth.      She has some hand pain with use/activity  She has migraines 2-4 times a month.   Maxalt has helped  She notes more  fatigue and is sleepy during the day.   She sleeps 6-10 hours a night.   She does snore but has never been noted to have gasping or snorts or other OSA signs.     Weight is stable.     She tried Provigil and it may have helped some.     No new mood issues --though she notes anxiety is a little bit worse and her psychologist asked her to inquire about buspirone.,. She feels apathetic at times.  Cognition is fine.        EPWORTH SLEEPINESS SCALE  On a scale of 0 - 3 what is the chance of dozing:  Sitting and Reading:   1 Watching TV:    2 Sitting inactive in a public place: 0 Passenger in car for one hour: 0 Lying down to rest in the afternoon: 3 Sitting and talking to someone: 0 Sitting quietly after lunch:  3 In a car, stopped in traffic:  0  Total (out of 24):    9/24     MS  history: She had the onset of left sided weakness in January 2021.    An MRI of the brain showed a large tumefactive lesion in the right frontal lobe associated with mass-effect and 5 other enhancing lesions that were smaller.  She also had a lumbar puncture performed a week later showing oligoclonal bands.  MRI of the spine did not show any MS lesions.  Her left-sided symptoms improved over time.   She was started on [facility] and took the first course in late January in late February 2021 and second year Jan/Feb 2022.    Imaging: MRI of the brain 03/06/2019 showed a large heterogenous rim-enhancing lesion in the right frontal lobe associated with mass-effect.  There are also 5 other enhancing lesions that are smaller and several other T2/FLAIR hyperintense foci that do not enhance.  The combination of enhancing and nonenhancing lesions is consistent with a diagnosis of multiple sclerosis.  The large focus likely represents a tumefactive lesion while the other enhancing lesions are more typical for MS.  MRI of the brain 02/18/2020 showed no new lesoins and resolution of the enhancement seen on previous MRI.    MRI of  the cervical and thoracic spine 04/12/2019 did not show any lesions within the spinal cord.  MRI brain 05/07/2021 showed Multiple T2/FLAIR hyperintense foci in the hemispheres in a pattern consistent with chronic demyelinating plaque associated with multiple sclerosis.  They do not enhance or appear to be acute.  Compared to the MRI from 02/18/2020, there are no new lesions.   Labs: CSF 03/13/2019 showed at least 5 oligoclonal bands.  Additionally the IgG index was elevated at 1.34.  Labs 03/07/2019: Hepatitis B, TB, Lyme, HIV were negative.  She is varicella immune (1491).  Vitamin D was mildly reduced.  REVIEW OF SYSTEMS: Constitutional: No fevers, chills, sweats, or change in appetite Eyes: No visual changes, double vision, eye pain Ear, nose and throat: No hearing loss, ear pain,  nasal congestion, sore throat Cardiovascular: No chest pain, palpitations Respiratory:  No shortness of breath at rest or with exertion.   No wheezes GastrointestinaI: No nausea, vomiting, diarrhea, abdominal pain, fecal incontinence Genitourinary:  No dysuria, urinary retention or frequency.  No nocturia. Musculoskeletal:  No neck pain, back pain Integumentary: No rash, pruritus, skin lesions Neurological: as above Psychiatric: No depression at this time.  No anxiety Endocrine: No palpitations, diaphoresis, change in appetite, change in weigh or increased thirst Hematologic/Lymphatic:  No anemia, purpura, petechiae. Allergic/Immunologic: No itchy/runny eyes, nasal congestion, recent allergic reactions, rashes  ALLERGIES: No Known Allergies  HOME MEDICATIONS:  Current Outpatient Medications:    busPIRone (BUSPAR) 15 MG tablet, Take 1 tablet (15 mg total) by mouth 2 (two) times daily., Disp: 60 tablet, Rfl: 5   Cladribine, 10 Tabs, (MAVENCLAD, 10 TABS,) 10 MG TBPK, Take by mouth. Month 1: Days 1-5: 2 tabs per day=total 10 tabs  Month 2: Days 1-5: 2 tabs per day=total 10 tabs, Disp: , Rfl:    phentermine 37.5 MG capsule, Take 1 capsule (37.5 mg total) by mouth every morning., Disp: 30 capsule, Rfl: 5   VITAMIN D PO, Take 200 mcg by mouth daily., Disp: , Rfl:    modafinil (PROVIGIL) 200 MG tablet, Take 1 tablet (200 mg total) by mouth daily., Disp: 90 tablet, Rfl: 1  PAST MEDICAL HISTORY: Past Medical History:  Diagnosis Date   Incoordination     PAST SURGICAL HISTORY: Past Surgical History:  Procedure Laterality Date   LASIK Bilateral 2017   REFRACTIVE SURGERY Bilateral     FAMILY HISTORY: Family History  Problem Relation Age of Onset   Thyroid disease Mother    Diabetes Father    Thyroid disease Brother     SOCIAL HISTORY:  Social History   Socioeconomic History   Marital status: Single    Spouse name: Not on file   Number of children: 0   Years of education:  college   Highest education level: Not on file  Occupational History   Occupation: Emergency planning/management officer  Tobacco Use   Smoking status: Never   Smokeless tobacco: Never  Vaping Use   Vaping Use: Never used  Substance and Sexual Activity   Alcohol use: Yes    Comment: very little   Drug use: Never   Sexual activity: Not on file  Other Topics Concern   Not on file  Social History Narrative   Lives at home with boyfriend.   Right-handed.   Drinks coffee, with extra espresso, in the mornings.  Occasionally uses caffeinated energy powder. (about 600mg  per day)   She works as a in [place].   Social Determinants of [place]  Strain: Not on file  Food Insecurity: Not on file  Transportation Needs: Not on file  Physical Activity: Not on file  Stress: Not on file  Social Connections: Not on file  Intimate Partner Violence: Not on file     PHYSICAL EXAM  Vitals:   10/18/21 1255  BP: 129/80  Pulse: 74  Weight: 253 lb 8 oz (115 kg)  Height: 5\' 7"  (1.702 m)    Body mass index is 39.7 kg/m.   General: The patient is well-developed and well-nourished and in no acute distress  HEENT:  Head is [place]/AT.  Sclera are anicteric.    Skin: Extremities are without rash or  edema.  Neurologic Exam  Mental status: The patient is alert and oriented x 3 at the time of the examination. The patient has apparent normal recent and remote memory, with an apparently normal attention span and concentration ability.   Speech is normal.  Cranial nerves: Extraocular movements are full.  Facial symmetry is present. There is good facial sensation to soft touch bilaterally.Facial strength is normal.  Trapezius and sternocleidomastoid strength is normal. No dysarthria is noted.   No obvious hearing deficits are noted.  Motor:  Muscle bulk is normal.   Tone is normal. Strength is  5 / 5 in all 4 extremities.   Sensory: N Phalen's or Tinels sings in arms.   Intact touch  and vibration sensation.  Coordination: Cerebellar testing reveals good finger-nose-finger and heel-to-shin bilaterally.  Gait and station: Station is normal.   Gait is normal. The tandem walk is mildly wide.  Romberg is negative.   Reflexes: Deep tendon reflexes are symmetric and normal bilaterally.        DIAGNOSTIC DATA (LABS, IMAGING, TESTING) - I reviewed patient records, labs, notes, testing and imaging myself where available.  Lab Results  Component Value Date   WBC 8.3 10/14/2020   HGB 13.1 10/14/2020   HCT 39.0 10/14/2020   MCV 92 10/14/2020   PLT 267 10/14/2020      Component Value Date/Time   NA 140 10/14/2020 0825   K 4.0 10/14/2020 0825   CL 105 10/14/2020 0825   CO2 20 10/14/2020 0825   GLUCOSE 88 10/14/2020 0825   BUN 12 10/14/2020 0825   CREATININE 0.90 10/14/2020 0825   CALCIUM 9.5 10/14/2020 0825   PROT 6.6 10/14/2020 0825   ALBUMIN 4.3 10/14/2020 0825   ALBUMIN 4.8 03/13/2019 0909   AST 25 10/14/2020 0825   ALT 15 10/14/2020 0825   ALKPHOS 84 10/14/2020 0825   BILITOT <0.2 10/14/2020 0825   GFRNONAA 112 02/12/2020 1432   GFRAA 129 02/12/2020 1432   No results found for: "CHOL", "HDL", "LDLCALC", "LDLDIRECT", "TRIG", "CHOLHDL" No results found for: "HGBA1C" Lab Results  Component Value Date   [id number] 02/26/2019   Lab Results  Component Value Date   TSH 0.758 06/02/2020       ASSESSMENT AND PLAN  Multiple sclerosis, relapsing-remitting ([place])  Other fatigue  Migraine without aura and without status migrainosus, not intractable  Anxiety  1.   She did the second year Mavenclad January/February 2022.  No further lab work is needed.  We will continue to check an MRI every 12 to 18 months to determine if there is breakthrough activity.  If this is occurring we would need to consider adding a disease modifying therapy.   2.    She is getting only partial benefit from Provigil.  I will also add phentermine.  If the daytime  sleepiness  worsens we would need to check a home sleep study. 3.     BuSpar for anxiety.  50 mg p.o. twice daily.   4. She will return in 6  months or sooner if there are new or worsening neurologic symptoms.   Visit after that can be in 6 months.     Detravion Tester A. Epimenio Foot, MD, [facility] 10/18/2021, 1:34 PM Certified in Neurology, Clinical Neurophysiology, Sleep Medicine and Neuroimaging  [facility] [address] [place], [place] [address] [phone]

## 2021-11-12 ENCOUNTER — Other Ambulatory Visit: Payer: Self-pay | Admitting: Neurology

## 2021-12-20 ENCOUNTER — Other Ambulatory Visit: Payer: Self-pay | Admitting: Neurology

## 2022-03-08 ENCOUNTER — Other Ambulatory Visit: Payer: Self-pay | Admitting: Neurology

## 2022-03-08 NOTE — Telephone Encounter (Signed)
Per 10/18/21 note "BuSpar for anxiety.  50 mg p.o. twice daily.   4. She will return in 6  months or sooner if there are new or worsening neurologic symptoms.  Follow up scheduled on 04/20/22 with Amy. Rx sent

## 2022-04-04 ENCOUNTER — Other Ambulatory Visit (HOSPITAL_COMMUNITY): Payer: Self-pay

## 2022-04-13 ENCOUNTER — Telehealth: Payer: Self-pay | Admitting: *Deleted

## 2022-04-13 NOTE — Telephone Encounter (Signed)
          PA needed for Modafinil tablet 200 mg, routed encounter to prior auth team.

## 2022-04-19 NOTE — Progress Notes (Unsigned)
No chief complaint on file.   HISTORY OF PRESENT ILLNESS:  04/19/22 ALL:  Julie Wagner is a 27 y.o. female here today for follow up for MS. She finished second round of [place] in February, 2022. Last lymph count 1.5 09/2020.   She is doing very well. She is working full time. 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 She denies any concerns of weakness or gait changes.   She was started on gabapentin post shingles infection. She is not certain it helped. She is no longer taking.   Fatigue . Phentermine was added to modafinil at last visit with Dr Felecia Shelling 09/2021.   Migraines are well managed. She may have 3-4 headaches one month and none for 1-2 months. She has used rizatriptan once since last being seen and reports this was during a viral infection. It did seem to help.   She is sleeping well. Her mood is good. She continues Buspar 8m BID.     HISTORY (copied from Dr SGarth Bignessprevious note)  Julie Wagner a 27y.o. woman with relapsoing remitting multiple sclerosis.   Update 04/20/2021 She did the second [place] of [place] and [place] 20[place].        Lymphocyte nadir secod year was 0.6 and was 1.5 (normal) 09/2020.      She feels her MS is mostly stable.  She has no difficulty with her gait or balance and goes downstairs without needing to use the bannister.    No numbness or weakness.    Bladder function ok - some urgency but no incontinence.   Vision is fine.   She recently saw ophth.       She has some hand pain with use/activity   She has migraines 2-4 times a month.   Maxalt has helped   She notes more  fatigue and is sleepy during the day.   She sleeps 6-10 hours a night.   She does snore but has never been noted to have gasping or snorts or other OSA signs.     Weight is stable.     She tried Provigil and it may have helped some.      No new mood issues --though she notes anxiety is a little bit worse and her psychologist  asked her to inquire about buspirone.,. She feels apathetic at times.  Cognition is fine.        EPWORTH SLEEPINESS SCALE   On a scale of 0 - 3 what is the chance of dozing:   Sitting and Reading:                           1 Watching TV:                                      2 Sitting inactive in a public place:        0 Passenger in car for one hour:           0 Lying down to rest in the afternoon:   3 Sitting and talking to someone:          0 Sitting quietly after lunch:                   3 In a car, stopped in traffic:  0   Total (out of 24):    9/24        MS history: She had the onset of left sided weakness in January 2021.    An MRI of the brain showed a large tumefactive lesion in the right frontal lobe associated with mass-effect and 5 other enhancing lesions that were smaller.  She also had a lumbar puncture performed a week later showing oligoclonal bands.  MRI of the spine did not show any MS lesions.  Her left-sided symptoms improved over time.   She was started on [facility] and took the first course in late January in late February 2021 and second year Jan/Feb 2022.     Imaging: MRI of the brain 03/06/2019 showed a large heterogenous rim-enhancing lesion in the right frontal lobe associated with mass-effect.  There are also 5 other enhancing lesions that are smaller and several other T2/FLAIR hyperintense foci that do not enhance.  The combination of enhancing and nonenhancing lesions is consistent with a diagnosis of multiple sclerosis.  The large focus likely represents a tumefactive lesion while the other enhancing lesions are more typical for MS.   MRI of the brain 02/18/2020 showed no new lesoins and resolution of the enhancement seen on previous MRI.     MRI of the cervical and thoracic spine 04/12/2019 did not show any lesions within the spinal cord.   MRI brain 05/07/2021 showed Multiple T2/FLAIR hyperintense foci in the hemispheres in a pattern  consistent with chronic demyelinating plaque associated with multiple sclerosis.  They do not enhance or appear to be acute.  Compared to the MRI from 02/18/2020, there are no new lesions.     Labs: CSF 03/13/2019 showed at least 5 oligoclonal bands.  Additionally the IgG index was elevated at 1.34.   Labs 03/07/2019: Hepatitis B, TB, Lyme, HIV were negative.  She is varicella immune (1491).  Vitamin D was mildly reduced.  REVIEW OF SYSTEMS: Out of a complete 14 system review of symptoms, the patient complains only of the following symptoms, headaches and all other reviewed systems are negative.   ALLERGIES: No Known Allergies   HOME MEDICATIONS: Outpatient Medications Prior to Visit  Medication Sig Dispense Refill   busPIRone (BUSPAR) 15 MG tablet TAKE 1 TABLET(15 MG) BY MOUTH TWICE DAILY 60 tablet 0   Cladribine, 10 Tabs, (MAVENCLAD, 10 TABS,) 10 MG TBPK Take by mouth. Month 1: Days 1-5: 2 tabs per day=total 10 tabs  Month 2: Days 1-5: 2 tabs per day=total 10 tabs     modafinil (PROVIGIL) 200 MG tablet TAKE 1 TABLET(200 MG) BY MOUTH DAILY 30 tablet 5   phentermine 37.5 MG capsule Take 1 capsule (37.5 mg total) by mouth every morning. 30 capsule 5   VITAMIN D PO Take 200 mcg by mouth daily.     No facility-administered medications prior to visit.     PAST MEDICAL HISTORY: Past Medical History:  Diagnosis Date   Incoordination      PAST SURGICAL HISTORY: Past Surgical History:  Procedure Laterality Date   LASIK Bilateral 2017   REFRACTIVE SURGERY Bilateral      FAMILY HISTORY: Family History  Problem Relation Age of Onset   Thyroid disease Mother    Diabetes Father    Thyroid disease Brother      SOCIAL HISTORY: Social History   Socioeconomic History   Marital status: Single    Spouse name: Not on file   Number of children: 0   Years of education:  college   Highest education level: Not on file  Occupational History   Occupation: Engineer, structural  Tobacco Use    Smoking status: Never   Smokeless tobacco: Never  Vaping Use   Vaping Use: Never used  Substance and Sexual Activity   Alcohol use: Yes    Comment: very little   Drug use: Never   Sexual activity: Not on file  Other Topics Concern   Not on file  Social History Narrative   Lives at home with boyfriend.   Right-handed.   Drinks coffee, with extra espresso, in the mornings.  Occasionally uses caffeinated energy powder. (about 678m per day)   [place]   Social Determinants of Health   Financial Resource Strain: Not on file  Food Insecurity: Not on file  Transportation Needs: Not on file  Physical Activity: Not on file  Stress: Not on file  Social Connections: Not on file  Intimate Partner Violence: Not on file     PHYSICAL EXAM  There were no vitals filed for this visit.  There is no height or weight on file to calculate BMI.   Generalized: Well developed, in no acute distress  Cardiology: normal rate and rhythm, no murmur auscultated  Respiratory: clear to auscultation bilaterally    Neurological examination  Mentation: Alert oriented to time, place, history taking. Follows all commands speech and language fluent Cranial nerve II-XII: Pupils were equal round reactive to light. Extraocular movements were full, visual field were full on confrontational test. Facial sensation and strength were normal. Uvula tongue midline. Head turning and shoulder shrug  were normal and symmetric. Motor: The motor testing reveals 5 over 5 strength of all 4 extremities. Good symmetric motor tone is noted throughout.  Sensory: Sensory testing is intact to soft touch on all 4 extremities. No evidence of extinction is noted.  Coordination: Cerebellar testing reveals good finger-nose-finger and heel-to-shin bilaterally.  Gait and station: Gait is normal. Tandem gait is normal. Romberg is negative. No drift is seen.  Reflexes: Deep tendon reflexes are  symmetric and normal bilaterally.    DIAGNOSTIC DATA (LABS, IMAGING, TESTING) - I reviewed patient records, labs, notes, testing and imaging myself where available.  Lab Results  Component Value Date   WBC 8.3 10/14/2020   HGB 13.1 10/14/2020   HCT 39.0 10/14/2020   MCV 92 10/14/2020   PLT 267 10/14/2020      Component Value Date/Time   NA 140 10/14/2020 0825   K 4.0 10/14/2020 0825   CL 105 10/14/2020 0825   CO2 20 10/14/2020 0825   GLUCOSE 88 10/14/2020 0825   BUN 12 10/14/2020 0825   CREATININE 0.90 10/14/2020 0825   CALCIUM 9.5 10/14/2020 0825   PROT 6.6 10/14/2020 0825   ALBUMIN 4.3 10/14/2020 0825   ALBUMIN 4.8 03/13/2019 0909   AST 25 10/14/2020 0825   ALT 15 10/14/2020 0825   ALKPHOS 84 10/14/2020 0825   BILITOT <0.2 10/14/2020 0825   GFRNONAA 112 02/12/2020 1432   GFRAA 129 02/12/2020 1432   No results found for: "CHOL", "HDL", "LDLCALC", "LDLDIRECT", "TRIG", "CHOLHDL" No results found for: "HGBA1C" Lab Results  Component Value Date   [id number]/29/2020   Lab Results  Component Value Date   TSH 0.758 06/02/2020        No data to display               No data to display  ASSESSMENT AND PLAN  27 y.o. year old female  has a past medical history of Incoordination. here with    No diagnosis found.  Cristin continues to do very well. We will update labs, today. She will continue rizatriptan as needed. She will focus on healthy lifestyle habits. She will follow up with Dr Felecia Shelling in 6 months, sooner if needed. She verbalizes understanding and agreement with this plan.    No orders of the defined types were placed in this encounter.     No orders of the defined types were placed in this encounter.    Debbora Presto, MSN, FNP-C 04/19/2022, 4:48 PM  [facility] [address] [place], [place] [address] [phone]

## 2022-04-19 NOTE — Patient Instructions (Incomplete)
Below is our plan:  We will continue current treatment plan. I will update labs today. Monitor headaches at home. Keep a close eye on your blood pressure. I have called in rizatriptan for migraine abortion.   Please make sure you are staying well hydrated. I recommend 50-60 ounces daily. Well balanced diet and regular exercise encouraged. Consistent sleep schedule with 6-8 hours recommended.   Please continue follow up with care team as directed.   Follow up with Dr Epimenio Foot in 6 months   You may receive a survey regarding today's visit. I encourage you to leave honest feed back as I do use this information to improve patient care. Thank you for seeing me today!

## 2022-04-20 ENCOUNTER — Other Ambulatory Visit (HOSPITAL_COMMUNITY): Payer: Self-pay

## 2022-04-20 ENCOUNTER — Encounter: Payer: Self-pay | Admitting: Family Medicine

## 2022-04-20 ENCOUNTER — Ambulatory Visit: Payer: 59 | Admitting: Family Medicine

## 2022-04-20 VITALS — BP 128/90 | HR 84 | Ht 66.0 in | Wt 236.6 lb

## 2022-04-20 DIAGNOSIS — G43009 Migraine without aura, not intractable, without status migrainosus: Secondary | ICD-10-CM

## 2022-04-20 DIAGNOSIS — G35 Multiple sclerosis: Secondary | ICD-10-CM

## 2022-04-20 DIAGNOSIS — F419 Anxiety disorder, unspecified: Secondary | ICD-10-CM

## 2022-04-20 DIAGNOSIS — R5383 Other fatigue: Secondary | ICD-10-CM | POA: Diagnosis not present

## 2022-04-20 DIAGNOSIS — Z79899 Other long term (current) drug therapy: Secondary | ICD-10-CM | POA: Diagnosis not present

## 2022-04-20 DIAGNOSIS — R5382 Chronic fatigue, unspecified: Secondary | ICD-10-CM

## 2022-04-20 MED ORDER — RIZATRIPTAN BENZOATE 10 MG PO TBDP: 10.0000 mg | ORAL_TABLET | ORAL | 11 refills | Status: AC | PRN

## 2022-04-20 MED ORDER — MODAFINIL 200 MG PO TABS: ORAL_TABLET | ORAL | 1 refills | Status: DC

## 2022-04-20 MED ORDER — PHENTERMINE HCL 37.5 MG PO CAPS: 37.5000 mg | ORAL_CAPSULE | ORAL | 1 refills | Status: DC

## 2022-04-20 MED ORDER — BUSPIRONE HCL 15 MG PO TABS: ORAL_TABLET | ORAL | 3 refills | Status: DC

## 2022-04-20 NOTE — Telephone Encounter (Signed)
Patient Advocate Encounter   Received notification that prior authorization for Modafinil 200MG tablets is required.   PA submitted on 04/20/2022 Key [id number] Status is pending       Lyndel Safe, [place] Patient Advocate Specialist [place] Patient Advocate Team Direct Number: [phone]  Fax: [phone]

## 2022-04-21 LAB — CBC WITH DIFFERENTIAL/PLATELET
Basophils Absolute: 0.1 10*3/uL (ref 0.0–0.2)
Basos: 1 %
EOS (ABSOLUTE): 0.1 10*3/uL (ref 0.0–0.4)
Eos: 2 %
Hematocrit: 42.5 % (ref 34.0–46.6)
Hemoglobin: 13.8 g/dL (ref 11.1–15.9)
Immature Grans (Abs): 0 10*3/uL (ref 0.0–0.1)
Immature Granulocytes: 0 %
Lymphocytes Absolute: 2 10*3/uL (ref 0.7–3.1)
Lymphs: 26 %
MCH: 30.5 pg (ref 26.6–33.0)
MCHC: 32.5 g/dL (ref 31.5–35.7)
MCV: 94 fL (ref 79–97)
Monocytes Absolute: 1 10*3/uL — ABNORMAL HIGH (ref 0.1–0.9)
Monocytes: 13 %
Neutrophils Absolute: 4.6 10*3/uL (ref 1.4–7.0)
Neutrophils: 58 %
Platelets: 306 10*3/uL (ref 150–450)
RBC: 4.53 x10E6/uL (ref 3.77–5.28)
RDW: 12 % (ref 11.7–15.4)
WBC: 7.8 10*3/uL (ref 3.4–10.8)

## 2022-04-21 LAB — COMPREHENSIVE METABOLIC PANEL
ALT: 11 IU/L (ref 0–32)
AST: 15 IU/L (ref 0–40)
Albumin/Globulin Ratio: 1.9 (ref 1.2–2.2)
Albumin: 4.6 g/dL (ref 4.0–5.0)
Alkaline Phosphatase: 100 IU/L (ref 44–121)
BUN/Creatinine Ratio: 16 (ref 9–23)
BUN: 15 mg/dL (ref 6–20)
Bilirubin Total: 0.3 mg/dL (ref 0.0–1.2)
CO2: 23 mmol/L (ref 20–29)
Calcium: 9.3 mg/dL (ref 8.7–10.2)
Chloride: 100 mmol/L (ref 96–106)
Creatinine, Ser: 0.95 mg/dL (ref 0.57–1.00)
Globulin, Total: 2.4 g/dL (ref 1.5–4.5)
Glucose: 75 mg/dL (ref 70–99)
Potassium: 4.4 mmol/L (ref 3.5–5.2)
Sodium: 137 mmol/L (ref 134–144)
Total Protein: 7 g/dL (ref 6.0–8.5)
eGFR: 84 mL/min/{1.73_m2} (ref >59)

## 2022-04-21 LAB — VITAMIN D 25 HYDROXY (VIT D DEFICIENCY, FRACTURES): Vit D, 25-Hydroxy: 73 ng/mL (ref 30.0–100.0)

## 2022-04-21 LAB — TSH: TSH: 0.467 u[IU]/mL (ref 0.450–4.500)

## 2022-04-21 LAB — VITAMIN B12: Vitamin B-12: 674 pg/mL (ref 232–1245)

## 2022-04-22 NOTE — Telephone Encounter (Signed)
Received from Insurance:  This medication or product is on your plan's list of covered drugs. Prior authorization is not required at this time

## 2022-05-04 ENCOUNTER — Telehealth: Payer: Self-pay | Admitting: *Deleted

## 2022-05-04 NOTE — Telephone Encounter (Signed)
Submitted PA phentermine on covermymeds. [id number]. Waiting on determination from [facility].

## 2022-05-05 NOTE — Telephone Encounter (Signed)
PA denied stating: "Treatment is being requested for appetite suppression or weight loss"

## 2022-05-14 ENCOUNTER — Other Ambulatory Visit: Payer: Self-pay | Admitting: Neurology

## 2022-05-16 NOTE — Telephone Encounter (Signed)
Last seen on 10/18/2021 Follow up scheduled on 10/26/22 Modafinil 200 mg tablet last filled on 04/13/22 # 30 Phentermine 37.5 mg tablet last filed on 04/13/22 # 30  Rx's pending to be signed

## 2022-05-16 NOTE — Telephone Encounter (Signed)
Pt called and LVM stating that the pharmacy has not received the authorization to fill these medications as of yet. Pt would like to know when this will be approved.

## 2022-05-16 NOTE — Telephone Encounter (Signed)
Please call pt back and let them know Dr. Felecia Shelling just sent in the prescription refills.

## 2022-08-10 ENCOUNTER — Other Ambulatory Visit: Payer: Self-pay | Admitting: Neurology

## 2022-08-11 NOTE — Telephone Encounter (Signed)
Requested Prescriptions   Pending Prescriptions Disp Refills   modafinil (PROVIGIL) 200 MG tablet [Pharmacy Med Name: MODAFINIL 200MG  TABLETS] 30 tablet     Sig: TAKE 1 TABLET(200 MG) BY MOUTH DAILY   Last seen 04/20/22, next appt scheduled for 10/26/22 Dispenses   Dispensed Days Supply Quantity Provider Pharmacy  MODAFINIL 200MG  TABLETS 07/15/2022 30 30 each Sater, Pearletha Furl, MD [facility] #...  MODAFINIL 200MG  TABLETS 06/15/2022 30 30 each Sater, Pearletha Furl, MD [facility] DR[facility] STORE #...  MODAFINIL 200MG  TABLETS 05/16/2022 30 30 each Sater, Pearletha Furl, MD [facility] DR[facility] STORE #...  MODAFINIL 200MG  TABLETS 04/13/2022 30 30 each Sater, Pearletha Furl, MD [facility] DR[facility] STORE #...  MODAFINIL 200MG  TABLETS 03/07/2022 30 30 each Sater, Pearletha Furl, MD [facility] DR[facility] STORE #...  MODAFINIL 200MG  TABLETS 01/24/2022 30 30 each Sater, Pearletha Furl, MD [facility] DR[facility] STORE #...  MODAFINIL 200MG  TABLETS 12/21/2021 30 30 each Sater, Pearletha Furl, MD [facility] DR[facility] STORE #...  MODAFINIL 200MG  TABLETS 11/15/2021 30 30 each Sater, Pearletha Furl, MD [facility] DRUG STORE #...  MODAFINIL 200MG  TABLETS 10/18/2021 30 30 each Sater, Pearletha Furl, MD [facility] DRUG STORE #...  MODAFINIL 200MG  TABLETS 09/06/2021 30 30 each Sater, Pearletha Furl, [facility] STORE #.Marland KitchenMarland Kitchen

## 2022-10-26 ENCOUNTER — Encounter: Payer: Self-pay | Admitting: Neurology

## 2022-10-26 ENCOUNTER — Ambulatory Visit: Payer: 59 | Admitting: Neurology

## 2022-11-15 ENCOUNTER — Encounter: Payer: Self-pay | Admitting: Family Medicine

## 2022-11-15 MED ORDER — PHENTERMINE HCL 37.5 MG PO CAPS: 37.5000 mg | ORAL_CAPSULE | ORAL | 0 refills | Status: DC

## 2022-11-15 NOTE — Telephone Encounter (Signed)
Last seen on 04/20/22 ( pt saw you last) Follow up scheduled on 11/22/22 Last filled on 10/19/22 #30 tablets (30 day supply) Rx pending to be signed

## 2022-11-22 ENCOUNTER — Ambulatory Visit: Payer: 59 | Admitting: Neurology

## 2022-11-22 ENCOUNTER — Encounter: Payer: Self-pay | Admitting: Neurology

## 2022-11-22 VITALS — BP 127/83 | HR 89 | Ht 66.6 in | Wt 237.0 lb

## 2022-11-22 DIAGNOSIS — G35 Multiple sclerosis: Secondary | ICD-10-CM | POA: Diagnosis not present

## 2022-11-22 DIAGNOSIS — Z79899 Other long term (current) drug therapy: Secondary | ICD-10-CM | POA: Diagnosis not present

## 2022-11-22 DIAGNOSIS — R200 Anesthesia of skin: Secondary | ICD-10-CM | POA: Diagnosis not present

## 2022-11-22 DIAGNOSIS — R5383 Other fatigue: Secondary | ICD-10-CM

## 2022-11-22 DIAGNOSIS — F419 Anxiety disorder, unspecified: Secondary | ICD-10-CM | POA: Diagnosis not present

## 2022-11-22 DIAGNOSIS — G35A Relapsing-remitting multiple sclerosis: Secondary | ICD-10-CM

## 2022-11-22 MED ORDER — PHENTERMINE HCL 37.5 MG PO CAPS: 37.5000 mg | ORAL_CAPSULE | ORAL | 5 refills | Status: DC

## 2022-11-22 MED ORDER — MODAFINIL 200 MG PO TABS: ORAL_TABLET | ORAL | 5 refills | Status: DC

## 2022-11-22 NOTE — Progress Notes (Signed)
[facility]  PATIENT: Julie Wagner DOB: 04/15/1995  REFERRING DOCTOR OR PCP: Levert Feinstein MD SOURCE: Patient, notes from Dr. Kennedy Bucker, lab reports, imaging results, MRI images personally reviewed.  _________________________________   HISTORICAL  CHIEF COMPLAINT:  Chief Complaint  Patient presents with   Follow-up    Rm 10. Alone. She states her hands bother her when doing task, cramping. No new concerns.    HISTORY OF PRESENT ILLNESS:  Julie Wagner is a 27 y.o. woman with relapsoing remitting multiple sclerosis.  Update 11/22/2022 She did the second year of Mavenclad January and February 2022.        Lymphocyte nadir secod year was 0.6 and was 1.5 (normal) 09/2020 and 2.0 .  MRI 05/07/2021 showed no new lesions  He MS is mostly stable.  Gait and balance are doing well and she usually goes downstairs without needing to use the bannister.    No numbness or weakness in hands. When she grips her hands or uses them for a while she gets an ache - R>L  Strength is fine.    Bladder function ok - some frequency and urgency but no incontinence.   Vision is fine.   She recently saw ophth.     She has migraines 2-3 times a month.   Maxalt has helped.   She notes fatigue and is sometimes sleepy during the day.   She sleeps 6-10 hours a night.   She does snore but has never been noted to have gasping or snorts or other OSA signs.     Weight is stable.     She tried Provigil and it may have helped some.   Phentermine helps more and she has lost a few pounds.  Mood is stable.  She has some anxiety but no longer sees psychology.  Buspar helped a bit.   She feels apathetic at times.  Cognition is fine.        EPWORTH SLEEPINESS SCALE  On a scale of 0 - 3 what is the chance of dozing:  Sitting and Reading:   1 Watching TV:    2 Sitting inactive in a public place: 0 Passenger in car for one hour: 0 Lying down to rest in the afternoon: 3 Sitting and talking to  someone: 0 Sitting quietly after lunch:  2 In a car, stopped in traffic:  0  Total (out of 24):    8/24     MS history: She had the onset of left sided weakness in January 2021.    An MRI of the brain showed a large tumefactive lesion in the right frontal lobe associated with mass-effect and 5 other enhancing lesions that were smaller.  She also had a lumbar puncture performed a week later showing oligoclonal bands.  MRI of the spine did not show any MS lesions.  Her left-sided symptoms improved over time.   She was started on [facility] and took the first course in late January in late February 2021 and second year Jan/Feb 2022.    Imaging: MRI of the brain 03/06/2019 showed a large heterogenous rim-enhancing lesion in the right frontal lobe associated with mass-effect.  There are also 5 other enhancing lesions that are smaller and several other T2/FLAIR hyperintense foci that do not enhance.  The combination of enhancing and nonenhancing lesions is consistent with a diagnosis of multiple sclerosis.  The large focus likely represents a tumefactive lesion while the other enhancing lesions are more typical for MS.  MRI  of the brain 02/18/2020 showed no new lesoins and resolution of the enhancement seen on previous MRI.    MRI of the cervical and thoracic spine 04/12/2019 did not show any lesions within the spinal cord.  MRI brain 05/07/2021 showed Multiple T2/FLAIR hyperintense foci in the hemispheres in a pattern consistent with chronic demyelinating plaque associated with multiple sclerosis.  They do not enhance or appear to be acute.  Compared to the MRI from 02/18/2020, there are no new lesions.   Labs: CSF 03/13/2019 showed at least 5 oligoclonal bands.  Additionally the IgG index was elevated at 1.34.  Labs 03/07/2019: Hepatitis B, TB, Lyme, HIV were negative.  She is varicella immune (1491).  Vitamin D was mildly reduced.  REVIEW OF SYSTEMS: Constitutional: No fevers, chills, sweats, or  change in appetite Eyes: No visual changes, double vision, eye pain Ear, nose and throat: No hearing loss, ear pain, nasal congestion, sore throat Cardiovascular: No chest pain, palpitations Respiratory:  No shortness of breath at rest or with exertion.   No wheezes GastrointestinaI: No nausea, vomiting, diarrhea, abdominal pain, fecal incontinence Genitourinary:  No dysuria, urinary retention or frequency.  No nocturia. Musculoskeletal:  No neck pain, back pain Integumentary: No rash, pruritus, skin lesions Neurological: as above Psychiatric: No depression at this time.  No anxiety Endocrine: No palpitations, diaphoresis, change in appetite, change in weigh or increased thirst Hematologic/Lymphatic:  No anemia, purpura, petechiae. Allergic/Immunologic: No itchy/runny eyes, nasal congestion, recent allergic reactions, rashes  ALLERGIES: No Known Allergies  HOME MEDICATIONS:  Current Outpatient Medications:    busPIRone (BUSPAR) 15 MG tablet, TAKE 1 TABLET(15 MG) BY MOUTH TWICE DAILY, Disp: 60 tablet, Rfl: 4   rizatriptan (MAXALT-MLT) 10 MG disintegrating tablet, Take 1 tablet (10 mg total) by mouth as needed for migraine. May repeat in 2 hours if needed, Disp: 9 tablet, Rfl: 11   VITAMIN D PO, Take by mouth daily., Disp: , Rfl:    Cladribine, 10 Tabs, (MAVENCLAD, 10 TABS,) 10 MG TBPK, Take by mouth. Month 1: Days 1-5: 2 tabs per day=total 10 tabs  Month 2: Days 1-5: 2 tabs per day=total 10 tabs (Patient not taking: Reported on 04/20/2022), Disp: , Rfl:    modafinil (PROVIGIL) 200 MG tablet, TAKE 1 TABLET(200 MG) BY MOUTH DAILY, Disp: 30 tablet, Rfl: 5   phentermine 37.5 MG capsule, Take 1 capsule (37.5 mg total) by mouth every morning., Disp: 30 capsule, Rfl: 5  PAST MEDICAL HISTORY: Past Medical History:  Diagnosis Date   Incoordination     PAST SURGICAL HISTORY: Past Surgical History:  Procedure Laterality Date   LASIK Bilateral 2017   REFRACTIVE SURGERY Bilateral     FAMILY  HISTORY: Family History  Problem Relation Age of Onset   Thyroid disease Mother    Diabetes Father    Thyroid disease Brother     SOCIAL HISTORY:  Social History   Socioeconomic History   Marital status: Single    Spouse name: Not on file   Number of children: 0   Years of education: college   Highest education level: Not on file  Occupational History   Occupation: Emergency planning/management officer  Tobacco Use   Smoking status: Never   Smokeless tobacco: Never  Vaping Use   Vaping status: Never Used  Substance and Sexual Activity   Alcohol use: Yes    Comment: very little   Drug use: Never   Sexual activity: Not on file  Other Topics Concern   Not on file  Social  History Narrative   Lives at home with boyfriend.   Right-handed.   Drinks coffee, with extra espresso, in the mornings.  Occasionally uses caffeinated energy powder. (about 600mg  per day)   She works as a Emergency planning/management officer in [place].   Social Determinants of Health   Financial Resource Strain: Not on file  Food Insecurity: Not on file  Transportation Needs: Not on file  Physical Activity: Not on file  Stress: Not on file  Social Connections: Unknown (07/13/2021)   Received from [facility], [facility]   Social Network    Social Network: Not on file  Intimate Partner Violence: Unknown (06/04/2021)   Received from [facility], [facility]   HITS    Physically Hurt: Not on file    Insult or Talk Down To: Not on file    Threaten Physical Harm: Not on file    Scream or Curse: Not on file     PHYSICAL EXAM  Vitals:   11/22/22 1000  BP: 127/83  Pulse: 89  Weight: 237 lb (107.5 kg)  Height: 5' 6.6" (1.692 m)     Body mass index is 37.57 kg/m.   General: The patient is well-developed and well-nourished and in no acute distress  HEENT:  Head is [place]/AT.  Sclera are anicteric.    Skin: Extremities are without rash or  edema.  Neurologic Exam  Mental status: The patient is alert and oriented x 3 at  the time of the examination. The patient has apparent normal recent and remote memory, with an apparently normal attention span and concentration ability.   Speech is normal.  Cranial nerves: Extraocular movements are full.  Facial symmetry is present. There is good facial sensation to soft touch bilaterally.Facial strength is normal.  Trapezius and sternocleidomastoid strength is normal. No dysarthria is noted.   No obvious hearing deficits are noted.  Motor:  Muscle bulk is normal.   Tone is normal. Strength is  5 / 5 in all 4 extremities.   Sensory: Hand sensation is normal and no Tinel's signs.   Intact touch and vibration sensation.  Coordination: Cerebellar testing reveals good finger-nose-finger and heel-to-shin bilaterally.  Gait and station: Station is normal.   Gait is normal. The tandem walk is minimally  wide.  Romberg is negative.   Reflexes: Deep tendon reflexes are symmetric and normal bilaterally.        DIAGNOSTIC DATA (LABS, IMAGING, TESTING) - I reviewed patient records, labs, notes, testing and imaging myself where available.  Lab Results  Component Value Date   WBC 7.8 04/20/2022   HGB 13.8 04/20/2022   HCT 42.5 04/20/2022   MCV 94 04/20/2022   PLT 306 04/20/2022      Component Value Date/Time   NA 137 04/20/2022 1357   K 4.4 04/20/2022 1357   CL 100 04/20/2022 1357   CO2 23 04/20/2022 1357   GLUCOSE 75 04/20/2022 1357   BUN 15 04/20/2022 1357   CREATININE 0.95 04/20/2022 1357   CALCIUM 9.3 04/20/2022 1357   PROT 7.0 04/20/2022 1357   ALBUMIN 4.6 04/20/2022 1357   ALBUMIN 4.8 03/13/2019 0909   AST 15 04/20/2022 1357   ALT 11 04/20/2022 1357   ALKPHOS 100 04/20/2022 1357   BILITOT 0.3 04/20/2022 1357   GFRNONAA 112 02/12/2020 1432   GFRAA 129 02/12/2020 1432   No results found for: "CHOL", "HDL", "LDLCALC", "LDLDIRECT", "TRIG", "CHOLHDL" No results found for: "HGBA1C" Lab Results  Component Value Date   VITAMINB12 674 04/20/2022  Lab Results   Component Value Date   TSH 0.467 04/20/2022       ASSESSMENT AND PLAN  Multiple sclerosis, relapsing-remitting (HCC) - Plan: MR BRAIN W WO CONTRAST  Anxiety  Numbness - Plan: MR BRAIN W WO CONTRAST  High risk medication use  Other fatigue  1.   She did the second year Mavenclad January/February 2022.  No further lab work is needed.  We will check MRI in next couple weeks and continue to check an MRI every 12 to 18 months to determine if there is breakthrough activity.  If this is occurring we would need to consider adding a disease modifying therapy.   2.    Continue phentermine.  If the daytime sleepiness worsens we would need to check a home sleep study. 3.    Continue BuSpar for anxiety 15 mg p.o. twice daily.   4. She will return in 6  months or sooner if there are new or worsening neurologic symptoms.   Visit after that can be in 6 months.     Celvin Taney A. Epimenio Foot, MD, [facility] 11/22/2022, 10:26 AM Certified in Neurology, Clinical Neurophysiology, Sleep Medicine and Neuroimaging  [facility] [address] [place], [place] [address] [phone]

## 2022-11-24 ENCOUNTER — Telehealth: Payer: Self-pay | Admitting: Neurology

## 2022-11-24 NOTE — Telephone Encounter (Signed)
[facility] NPR case #[id number] patient wants to call GI to schedule [phone]

## 2023-01-10 ENCOUNTER — Ambulatory Visit
Admission: RE | Admit: 2023-01-10 | Discharge: 2023-01-10 | Disposition: A | Discharge status: Home / Self Care | Payer: 59 | Source: Ambulatory Visit | Attending: Neurology | Admitting: Neurology

## 2023-01-10 DIAGNOSIS — R200 Anesthesia of skin: Secondary | ICD-10-CM | POA: Diagnosis not present

## 2023-01-10 DIAGNOSIS — G35A Relapsing-remitting multiple sclerosis: Secondary | ICD-10-CM

## 2023-01-10 DIAGNOSIS — G35 Multiple sclerosis: Secondary | ICD-10-CM | POA: Diagnosis not present

## 2023-01-10 MED ORDER — GADOPICLENOL 0.5 MMOL/ML IV SOLN
10.0000 mL | Freq: Once | INTRAVENOUS | Status: AC | PRN
  Administered 2023-01-10: 10 mL via INTRAVENOUS

## 2023-03-02 ENCOUNTER — Other Ambulatory Visit: Payer: Self-pay | Admitting: Neurology

## 2023-03-02 NOTE — Telephone Encounter (Signed)
 Last seen on 11/22/22 Follow up scheduled on 06/22/23 Last seen on 01/29/23 #30 tablets (30 day supply) Rx pending to be signed

## 2023-03-20 ENCOUNTER — Telehealth: Payer: Self-pay

## 2023-03-20 NOTE — Telephone Encounter (Signed)
Faxed Medical Clearance for IV Sedation Dentistry for off to [phone]

## 2023-03-20 NOTE — Telephone Encounter (Signed)
[facility]) calling to check on the status of surgery clearance form. Can fax to  [phone]. Phone: [phone]

## 2023-03-20 NOTE — Telephone Encounter (Signed)
Faxed to [facility] [phone] 03/20/2023

## 2023-05-01 ENCOUNTER — Other Ambulatory Visit: Payer: Self-pay | Admitting: *Deleted

## 2023-05-01 MED ORDER — BUSPIRONE HCL 15 MG PO TABS: ORAL_TABLET | ORAL | 1 refills | Status: DC

## 2023-05-02 ENCOUNTER — Encounter: Payer: Self-pay | Admitting: Family Medicine

## 2023-06-15 ENCOUNTER — Telehealth: Payer: Self-pay | Admitting: Neurology

## 2023-06-15 NOTE — Telephone Encounter (Signed)
 Noted.

## 2023-06-15 NOTE — Telephone Encounter (Signed)
 r/s appointment due to a conflict

## 2023-06-22 ENCOUNTER — Ambulatory Visit: Payer: 59 | Admitting: Family Medicine

## 2023-06-23 ENCOUNTER — Encounter: Payer: Self-pay | Admitting: Family Medicine

## 2023-06-23 ENCOUNTER — Other Ambulatory Visit: Payer: Self-pay | Admitting: Neurology

## 2023-06-26 MED ORDER — PHENTERMINE HCL 37.5 MG PO CAPS: 37.5000 mg | ORAL_CAPSULE | Freq: Every morning | ORAL | 0 refills | Status: DC

## 2023-06-26 NOTE — Addendum Note (Signed)
 Addended by: Santana Cue A on: 06/26/2023 09:36 AM   Modules accepted: Orders

## 2023-06-27 ENCOUNTER — Other Ambulatory Visit (HOSPITAL_COMMUNITY): Payer: Self-pay

## 2023-06-27 ENCOUNTER — Telehealth: Payer: Self-pay

## 2023-06-27 NOTE — Telephone Encounter (Signed)
 Pharmacy Patient Advocate Encounter   Received notification from CoverMyMeds that prior authorization for Phentermine  HCl 37.5MG  capsules is required/requested.   Insurance verification completed.   The patient is insured through [facility] .   Per test claim: PA required; PA submitted to above mentioned insurance via CoverMyMeds Key/confirmation #/EOC [id number] Status is pending  If denied-appears PT has used GOODDRX in the past due to Fatigue DX not being covered.

## 2023-06-29 NOTE — Telephone Encounter (Signed)
 Pharmacy Patient Advocate Encounter  Received notification from [facility] that Prior Authorization for Phentermine  HCl 37.5MG  capsules has been DENIED.  Full denial letter will be uploaded to the media tab. See denial reason below.   PA #/Case ID/Reference #: PA Case ID #: [id number]

## 2023-06-29 NOTE — Telephone Encounter (Signed)
 Phentermine  prescribed for fatigue related to Multiple Sclerosis. Can you resubmit with this dx or appeal if you cannot resubmit? Thank you!

## 2023-07-04 NOTE — Telephone Encounter (Signed)
 Dr. Godwin Lat- do you want them to try and appeal with alternate dx or have her use goodrx coupon?

## 2023-07-04 NOTE — Telephone Encounter (Signed)
 LVM for pt letting her know insurance denied phentermine . Can use goodrx coupon to fill. Explained how to do this. Asked her to call back if she has any further questions.

## 2023-07-04 NOTE — Telephone Encounter (Signed)
 I referred to the pharmacist for an appeals review and per research  could not find any published studies to support the off label use of the drug. An appeal would most likely not be effective.

## 2023-07-17 NOTE — Patient Instructions (Signed)
 Below is our plan:  We will continue modafinil , phentermine  and rizatriptan  as prescribed. Increase Buspar  to 45mg  daily. Try taking 15mg  at end of your shift and 30mg  right before bedtime.   Please make sure you are staying well hydrated. I recommend 50-60 ounces daily. Well balanced diet and regular exercise encouraged. Consistent sleep schedule with 6-8 hours recommended.   Please continue follow up with care team as directed.   Follow up with Dr Godwin Lat in 6 months   You may receive a survey regarding today's visit. I encourage you to leave honest feed back as I do use this information to improve patient care. Thank you for seeing me today!

## 2023-07-17 NOTE — Progress Notes (Signed)
 Chief Complaint  Patient presents with   Follow-up    Pt in room 1 alone. Here for MS follow up. Pt reports she doing okay, reports having a hard time sleeping, staying asleep. Last eye exam June 2024, no falls.     HISTORY OF PRESENT ILLNESS:  07/18/23 ALL:  Julie Wagner is a 28 y.o. female here today for follow up for MS. She finished second round of Mavenclad in February, 2022. Last lymph count 1.5 09/2020. MRI stable 12/2022.   She is doing very well. She is working full time. 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 She denies any concerns of weakness or gait changes.   Fatigue is better. Phentermine  was added to modafinil  with Julie Wagner 09/2021. She does not feel she can go without these meds. She has tried skipping doses of one or the other and has significant fatigue. Tolerating it well. BP is normal.   Migraines wax and wane. She is having 2-4 headache days. Retro orbital pain bilaterally. She is sensitive to light and sound. She drinks 60+ ounces each day. Using rizatriptan  when needed.    She is not sleeping as well. She works 4p-3a. She works 4 days on 4 days off. Her mood is stable. She continues Buspar . Taking 30mg  at bedtime. She is able to get to sleep but having a hard time staying asleep. She has tried multiple OTC sleep aids. She doesn't like how she feels taking Buspar  when working. She is seeing a Veterinary surgeon.   HISTORY (copied from Julie Wagner previous note)  Julie Wagner is a 28 y.o. woman with relapsoing remitting multiple sclerosis.   Update 11/22/2022 She did the second year of Mavenclad January and February 2022.        Lymphocyte nadir secod year was 0.6 and was 1.5 (normal) 09/2020 and 2.0 .  MRI 05/07/2021 showed no new lesions   He MS is mostly stable.  Gait and balance are doing well and she usually goes downstairs without needing to use the bannister.    No numbness or weakness in hands. When she grips her hands or uses  them for a while she gets an ache - R>L  Strength is fine.    Bladder function ok - some frequency and urgency but no incontinence.   Vision is fine.   She recently saw ophth.      She has migraines 2-3 times a month.   Maxalt  has helped.    She notes fatigue and is sometimes sleepy during the day.   She sleeps 6-10 hours a night.   She does snore but has never been noted to have gasping or snorts or other OSA signs.     Weight is stable.     She tried Provigil  and it may have helped some.   Phentermine  helps more and she has lost a few pounds.   Mood is stable.  She has some anxiety but no longer sees psychology.  Buspar  helped a bit.   She feels apathetic at times.  Cognition is fine.        EPWORTH SLEEPINESS SCALE   On a scale of 0 - 3 what is the chance of dozing:   Sitting and Reading:                           1 Watching TV:  2 Sitting inactive in a public place:0 Passenger in car for one hour:0 Lying down to rest in the afternoon:3 Sitting and talking to someone:0 Sitting quietly after lunch:                   2 In a car, stopped in traffic:                  0   Total (out of 24):    8/24    MS history: She had the onset of left sided weakness in January 2021.    An MRI of the brain showed a large tumefactive lesion in the right frontal lobe associated with mass-effect and 5 other enhancing lesions that were smaller.  She also had a lumbar puncture performed a week later showing oligoclonal bands.  MRI of the spine did not show any MS lesions.  Her left-sided symptoms improved over time.   She was started on [facility] and took the first course in late January in late February 2021 and second year Jan/Feb 2022.     Imaging: MRI of the brain 03/06/2019 showed a large heterogenous rim-enhancing lesion in the right frontal lobe associated with mass-effect.  There are also 5 other enhancing lesions that are smaller and several other T2/FLAIR  hyperintense foci that do not enhance.  The combination of enhancing and nonenhancing lesions is consistent with a diagnosis of multiple sclerosis.  The large focus likely represents a tumefactive lesion while the other enhancing lesions are more typical for MS.   MRI of the brain 02/18/2020 showed no new lesoins and resolution of the enhancement seen on previous MRI.     MRI of the cervical and thoracic spine 04/12/2019 did not show any lesions within the spinal cord.   MRI brain 05/07/2021 showed Multiple T2/FLAIR hyperintense foci in the hemispheres in a pattern consistent with chronic demyelinating plaque associated with multiple sclerosis.  They do not enhance or appear to be acute.  Compared to the MRI from 02/18/2020, there are no new lesions.     Labs: CSF 03/13/2019 showed at least 5 oligoclonal bands.  Additionally the IgG index was elevated at 1.34.   Labs 03/07/2019: Hepatitis B, TB, Lyme, HIV were negative.  She is varicella immune (1491).  Vitamin D  was mildly reduced.  REVIEW OF SYSTEMS: Out of a complete 14 system review of symptoms, the patient complains only of the following symptoms, headaches, fatigue, anxiety and all other reviewed systems are negative.   ALLERGIES: No Known Allergies   HOME MEDICATIONS: Outpatient Medications Prior to Visit  Medication Sig Dispense Refill   rizatriptan  (MAXALT -MLT) 10 MG disintegrating tablet Take 1 tablet (10 mg total) by mouth as needed for migraine. May repeat in 2 hours if needed 9 tablet 11   VITAMIN D  PO Take by mouth daily.     busPIRone  (BUSPAR ) 15 MG tablet TAKE 1 TABLET(15 MG) BY MOUTH TWICE DAILY 60 tablet 1   modafinil  (PROVIGIL ) 200 MG tablet TAKE 1 TABLET(200 MG) BY MOUTH DAILY 90 tablet 1   phentermine  37.5 MG capsule Take 1 capsule (37.5 mg total) by mouth in the morning. 30 capsule 0   Cladribine, 10 Tabs, (MAVENCLAD, 10 TABS,) 10 MG TBPK Take by mouth. Month 1: Days 1-5: 2 tabs per day=total 10 tabs  Month 2: Days 1-5:  2 tabs per day=total 10 tabs (Patient not taking: Reported on 07/18/2023)     No facility-administered medications prior to visit.  PAST MEDICAL HISTORY: Past Medical History:  Diagnosis Date   Incoordination      PAST SURGICAL HISTORY: Past Surgical History:  Procedure Laterality Date   LASIK Bilateral 2017   REFRACTIVE SURGERY Bilateral      FAMILY HISTORY: Family History  Problem Relation Age of Onset   Thyroid  disease Mother    Diabetes Father    Thyroid  disease Brother      SOCIAL HISTORY: Social History   Socioeconomic History   Marital status: Single    Spouse name: Not on file   Number of children: 0   Years of education: college   Highest education level: Not on file  Occupational History   Occupation: Emergency planning/management officer  Tobacco Use   Smoking status: Never   Smokeless tobacco: Never  Vaping Use   Vaping status: Never Used  Substance and Sexual Activity   Alcohol use: Not Currently    Comment: very little   Drug use: Never   Sexual activity: Not on file  Other Topics Concern   Not on file  Social History Narrative   Lives at home with boyfriend.   Right-handed.   Drinks coffee, with extra espresso, in the mornings.  Occasionally uses caffeinated energy powder. (about 600mg  per day)   She works as a Emergency planning/management officer in [place].   Social Drivers of Corporate investment banker Strain: Not on file  Food Insecurity: Not on file  Transportation Needs: Not on file  Physical Activity: Not on file  Stress: Not on file  Social Connections: Unknown (07/13/2021)   Received from [facility]   Social Network    Social Network: Not on file  Intimate Partner Violence: Unknown (06/04/2021)   [facility], [facility]   HITS    Physically Hurt: Not on file    Insult or Talk Down To: Not on file    Threaten Physical Harm: Not on file    Scream or Curse: Not on file     PHYSICAL EXAM  Vitals:   07/18/23 1415  BP:  120/77  Pulse: 79  Weight: 238 lb (108 kg)  Height: 5\' 6"  (1.676 m)     Body mass index is 38.41 kg/m.   Generalized: Well developed, in no acute distress  Cardiology: normal rate and rhythm, no murmur auscultated  Respiratory: clear to auscultation bilaterally    Neurological examination  Mentation: Alert oriented to time, place, history taking. Follows all commands speech and language fluent Cranial nerve II-XII: Pupils were equal round reactive to light. Extraocular movements were full, visual field were full on confrontational test. Facial sensation and strength were normal. Uvula tongue midline. Head turning and shoulder shrug  were normal and symmetric. Motor: The motor testing reveals 5 over 5 strength of all 4 extremities. Good symmetric motor tone is noted throughout.  Sensory: Sensory testing is intact to soft touch on all 4 extremities. No evidence of extinction is noted.  Coordination: Cerebellar testing reveals good finger-nose-finger and heel-to-shin bilaterally.  Gait and station: Gait is normal. Tandem gait is normal. Romberg is negative. No drift is seen.  Reflexes: Deep tendon reflexes are symmetric and normal bilaterally.    DIAGNOSTIC DATA (LABS, IMAGING, TESTING) - I reviewed patient records, labs, notes, testing and imaging myself where available.  Lab Results  Component Value Date   WBC 7.8 04/20/2022   HGB 13.8 04/20/2022   HCT 42.5 04/20/2022   MCV 94 04/20/2022   PLT 306 04/20/2022  Component Value Date/Time   NA 137 04/20/2022 1357   K 4.4 04/20/2022 1357   CL 100 04/20/2022 1357   CO2 23 04/20/2022 1357   GLUCOSE 75 04/20/2022 1357   BUN 15 04/20/2022 1357   CREATININE 0.95 04/20/2022 1357   CALCIUM 9.3 04/20/2022 1357   PROT 7.0 04/20/2022 1357   ALBUMIN 4.6 04/20/2022 1357   ALBUMIN 4.8 03/13/2019 0909   AST 15 04/20/2022 1357   ALT 11 04/20/2022 1357   ALKPHOS 100 04/20/2022 1357   BILITOT 0.3 04/20/2022 1357   GFRNONAA 112  02/12/2020 1432   GFRAA 129 02/12/2020 1432   No results found for: "CHOL", "HDL", "LDLCALC", "LDLDIRECT", "TRIG", "CHOLHDL" No results found for: "HGBA1C" Lab Results  Component Value Date   VITAMINB12 674 04/20/2022   Lab Results  Component Value Date   TSH 0.467 04/20/2022        No data to display               No data to display           ASSESSMENT AND PLAN  28 y.o. year old female  has a past medical history of Incoordination. here with    Multiple sclerosis, relapsing-remitting (HCC)  High risk medication use  Anxiety  Numbness  Migraine without aura and without status migrainosus, not intractable  Other fatigue  Julie Wagner continues to do well with exception of sleep maintenance difficulty. Likely related to shift work. We will increase Buspar  to 45mg  daily. Advised to try taking 15mg  at end of shift and 30mg  at bedtime. She will continue modafinil  200mg  and phentermine  37.5mg  daily. She will continue rizatriptan  as needed. She will focus on healthy lifestyle habits. Sleep hygiene advised. She will follow up with Julie Wagner in 6 months, sooner if needed. She verbalizes understanding and agreement with this plan.    No orders of the defined types were placed in this encounter.     Meds ordered this encounter  Medications   busPIRone  (BUSPAR ) 15 MG tablet    Sig: Take 3 tablets (45 mg total) by mouth at bedtime. TAKE 1 TABLET(15 MG) BY MOUTH TWICE DAILY    Dispense:  270 tablet    Refill:  3   phentermine  37.5 MG capsule    Sig: Take 1 capsule (37.5 mg total) by mouth in the morning.    Dispense:  30 capsule    Refill:  0   modafinil  (PROVIGIL ) 200 MG tablet    Sig: TAKE 1 TABLET(200 MG) BY MOUTH DAILY    Dispense:  90 tablet    Refill:  1     Franklin Baumbach, MSN, FNP-C 07/18/2023, 2:49 PM  [facility] [address], [address] [place], [place] [address] [phone]

## 2023-07-18 ENCOUNTER — Encounter: Payer: Self-pay | Admitting: Family Medicine

## 2023-07-18 ENCOUNTER — Ambulatory Visit: Admitting: Family Medicine

## 2023-07-18 VITALS — BP 120/77 | HR 79 | Ht 66.0 in | Wt 238.0 lb

## 2023-07-18 DIAGNOSIS — F419 Anxiety disorder, unspecified: Secondary | ICD-10-CM

## 2023-07-18 DIAGNOSIS — G35 Multiple sclerosis: Secondary | ICD-10-CM

## 2023-07-18 DIAGNOSIS — Z79899 Other long term (current) drug therapy: Secondary | ICD-10-CM

## 2023-07-18 DIAGNOSIS — R5383 Other fatigue: Secondary | ICD-10-CM

## 2023-07-18 DIAGNOSIS — R200 Anesthesia of skin: Secondary | ICD-10-CM

## 2023-07-18 DIAGNOSIS — G43009 Migraine without aura, not intractable, without status migrainosus: Secondary | ICD-10-CM

## 2023-07-18 MED ORDER — BUSPIRONE HCL 15 MG PO TABS: 45.0000 mg | ORAL_TABLET | Freq: Every day | ORAL | 3 refills | Status: DC

## 2023-07-18 MED ORDER — MODAFINIL 200 MG PO TABS: ORAL_TABLET | ORAL | 1 refills | Status: DC

## 2023-07-18 MED ORDER — PHENTERMINE HCL 37.5 MG PO CAPS: 37.5000 mg | ORAL_CAPSULE | Freq: Every morning | ORAL | 0 refills | Status: DC

## 2023-07-19 ENCOUNTER — Other Ambulatory Visit: Payer: Self-pay | Admitting: *Deleted

## 2023-07-19 MED ORDER — BUSPIRONE HCL 15 MG PO TABS: 45.0000 mg | ORAL_TABLET | Freq: Every day | ORAL | 3 refills | Status: DC

## 2023-07-19 NOTE — Telephone Encounter (Signed)
 [facility] sent a fax asking to clarify directions for Rx for buspirone  15 mg tablet has 2 set of directions on Rx. [facility] is asking for a new Rx to be sent with 1 set of directions.   Per Amy's note on 07/18/23 "We will increase Buspar  to 45mg  daily. "  Rx sent with correct directions it appears directions say 3 tablets (45mg ) at bedtime and 1 po bid..   New Rx sent.

## 2023-08-09 ENCOUNTER — Other Ambulatory Visit: Payer: Self-pay | Admitting: Neurology

## 2023-08-09 NOTE — Telephone Encounter (Signed)
 Last seen on 07/18/23 Follow up scheduled on 02/14/24

## 2023-09-11 ENCOUNTER — Other Ambulatory Visit: Payer: Self-pay | Admitting: *Deleted

## 2023-09-11 ENCOUNTER — Other Ambulatory Visit: Payer: Self-pay | Admitting: Neurology

## 2023-09-11 NOTE — Telephone Encounter (Signed)
 Last seen on 07/18/23 Follow up scheduled on 02/14/24   Dispensed Days Supply Quantity Provider Pharmacy  MODAFINIL  200MG  TABLETS 08/09/2023 30 30 each Sater, Charlie LABOR, MD [facility]ORE #...     Rx pending to be signed

## 2023-09-11 NOTE — Telephone Encounter (Signed)
 Last seen on 07/18/23 Follow up scheduled on 02/14/24   Dispensed Days Supply Quantity Provider Pharmacy  PHENTERMINE  37.5MG  CAPSULES 08/09/2023 30 30 each Lomax, Amy, NP [facility] DRUG STORE #...   Rx pending to be signed

## 2023-09-12 ENCOUNTER — Encounter: Payer: Self-pay | Admitting: Family Medicine

## 2023-09-12 MED ORDER — PHENTERMINE HCL 37.5 MG PO CAPS: 37.5000 mg | ORAL_CAPSULE | Freq: Every morning | ORAL | 5 refills | Status: DC

## 2023-09-12 MED ORDER — PHENTERMINE HCL 37.5 MG PO CAPS: 37.5000 mg | ORAL_CAPSULE | Freq: Every morning | ORAL | 0 refills | Status: DC

## 2023-09-12 NOTE — Telephone Encounter (Signed)
 Looks like Amy sent in refill today but it had no refills. Called [facility] at  [phone]. Cx rx sent in today. Will resend to AL,NP to have refills on prescription.

## 2024-02-14 ENCOUNTER — Encounter: Payer: Self-pay | Admitting: Neurology

## 2024-02-14 ENCOUNTER — Ambulatory Visit: Admitting: Neurology

## 2024-02-14 VITALS — BP 127/84 | HR 78 | Ht 66.0 in | Wt 246.0 lb

## 2024-02-14 DIAGNOSIS — G35A Relapsing-remitting multiple sclerosis: Secondary | ICD-10-CM | POA: Diagnosis not present

## 2024-02-14 DIAGNOSIS — F419 Anxiety disorder, unspecified: Secondary | ICD-10-CM | POA: Diagnosis not present

## 2024-02-14 DIAGNOSIS — G43009 Migraine without aura, not intractable, without status migrainosus: Secondary | ICD-10-CM | POA: Diagnosis not present

## 2024-02-14 DIAGNOSIS — Z79899 Other long term (current) drug therapy: Secondary | ICD-10-CM

## 2024-02-14 DIAGNOSIS — G4700 Insomnia, unspecified: Secondary | ICD-10-CM | POA: Diagnosis not present

## 2024-02-14 MED ORDER — MODAFINIL 200 MG PO TABS: ORAL_TABLET | ORAL | 1 refills | Status: AC

## 2024-02-14 MED ORDER — PHENTERMINE HCL 37.5 MG PO CAPS: 37.5000 mg | ORAL_CAPSULE | Freq: Every morning | ORAL | 5 refills | Status: AC

## 2024-02-14 MED ORDER — BUSPIRONE HCL 15 MG PO TABS: ORAL_TABLET | ORAL | 5 refills | Status: AC

## 2024-02-14 MED ORDER — TRAZODONE HCL 50 MG PO TABS: 50.0000 mg | ORAL_TABLET | Freq: Every day | ORAL | 11 refills | Status: DC

## 2024-02-14 NOTE — Progress Notes (Signed)
 [facility]  PATIENT: Julie Wagner DOB: 1995-05-24  REFERRING DOCTOR OR PCP: Modena Callander MD SOURCE: Patient, notes from Dr. Lorrene, lab reports, imaging results, MRI images personally reviewed.  _________________________________   HISTORICAL  CHIEF COMPLAINT:  Chief Complaint  Patient presents with   RM11/MS/MIGRAINES    Pt is here Alone. Pt states she has been stable since her last appointment.     HISTORY OF PRESENT ILLNESS:  Julie Wagner is a 28 y.o. woman with relapsoing remitting multiple sclerosis.  Update 02/14/2024 Her MS has been stable.  She did the second year of Mavenclad January and February 2022.        Lymphocyte nadir second year was 0.6 and was 1.5 (normal) 09/2020.  MRI 12/2022 showed no new lesions  She feels her MS is stable.  No falls or significant stumble. .  Gait and balance are doing well and she usually goes downstairs without needing to use the bannister.    No numbness or weakness in hands. Rare tingling in her hands, and some pain if she grips harder.    Strength is fine.    Bladder function ok - some frequency and urgency but no incontinence. Not bad enough for a medication   Vision is fine.   She recently saw ophth.     She has migraines less than 1/month now.   Maxalt  has helped (does not always take)   She notes fatigue and is sometimes sleepy during the day.   She sleeps 2-6 hours a night - both sleep onset and maintenance insomnia have been worse the last few months.   She does snore but has never been noted to have gasping or snorts or other OSA signs.     Weight is stable.     She tried Provigil  and it may have helped some.   Phentermine  helps more and she has lost a few pounds.  Mood is stable.  She feels that could be mild depression not necessarily bad enough to the medication.  She has some anxiety but no longer sees psychology.  Buspar  helped a bit.   She feels apathetic at times.  Cognition is fine.        EPWORTH  SLEEPINESS SCALE  On a scale of 0 - 3 what is the chance of dozing:  Sitting and Reading:   1 Watching TV:    1 Sitting inactive in a public place: 0 Passenger in car for one hour: 0 Lying down to rest in the afternoon: 3 Sitting and talking to someone: 0 Sitting quietly after lunch:  2 In a car, stopped in traffic:  0  Total (out of 24):    7/24     MS history: She had the onset of left sided weakness in January 2021.    An MRI of the brain showed a large tumefactive lesion in the right frontal lobe associated with mass-effect and 5 other enhancing lesions that were smaller.  She also had a lumbar puncture performed a week later showing oligoclonal bands.  MRI of the spine did not show any MS lesions.  Her left-sided symptoms improved over time.   She was started on [facility] and took the first course in late January in late February 2021 and second year Jan/Feb 2022.    Imaging: MRI of the brain 03/06/2019 showed a large heterogenous rim-enhancing lesion in the right frontal lobe associated with mass-effect.  There are also 5 other enhancing lesions that are smaller and  several other T2/FLAIR hyperintense foci that do not enhance.  The combination of enhancing and nonenhancing lesions is consistent with a diagnosis of multiple sclerosis.  The large focus likely represents a tumefactive lesion while the other enhancing lesions are more typical for MS.  MRI of the brain 02/18/2020 showed no new lesoins and resolution of the enhancement seen on previous MRI.    MRI of the cervical and thoracic spine 04/12/2019 did not show any lesions within the spinal cord.  MRI brain 05/07/2021 showed Multiple T2/FLAIR hyperintense foci in the hemispheres in a pattern consistent with chronic demyelinating plaque associated with multiple sclerosis.  They do not enhance or appear to be acute.  Compared to the MRI from 02/18/2020, there are no new lesions.  MRI brain 12/2022 showed Multiple T2/FLAIR  hyperintense foci in the periventricular, juxtacortical and deep white matter of the cerebral hemispheres in a pattern consistent with chronic demyelinating plaque associated with multiple sclerosis. No other foci enhanced or appear to be acute. Compared to the MRI from 05/07/2021, there were no new lesions.   Labs: CSF 03/13/2019 showed at least 5 oligoclonal bands.  Additionally the IgG index was elevated at 1.34.  Labs 03/07/2019: Hepatitis B, TB, Lyme, HIV were negative.  She is varicella immune (1491).  Vitamin D  was mildly reduced.  REVIEW OF SYSTEMS: Constitutional: No fevers, chills, sweats, or change in appetite Eyes: No visual changes, double vision, eye pain Ear, nose and throat: No hearing loss, ear pain, nasal congestion, sore throat Cardiovascular: No chest pain, palpitations Respiratory:  No shortness of breath at rest or with exertion.   No wheezes GastrointestinaI: No nausea, vomiting, diarrhea, abdominal pain, fecal incontinence Genitourinary:  No dysuria, urinary retention or frequency.  No nocturia. Musculoskeletal:  No neck pain, back pain Integumentary: No rash, pruritus, skin lesions Neurological: as above Psychiatric: No depression at this time.  No anxiety Endocrine: No palpitations, diaphoresis, change in appetite, change in weigh or increased thirst Hematologic/Lymphatic:  No anemia, purpura, petechiae. Allergic/Immunologic: No itchy/runny eyes, nasal congestion, recent allergic reactions, rashes  ALLERGIES: No Known Allergies  HOME MEDICATIONS:  Current Outpatient Medications:    rizatriptan  (MAXALT -MLT) 10 MG disintegrating tablet, Take 1 tablet (10 mg total) by mouth as needed for migraine. May repeat in 2 hours if needed, Disp: 9 tablet, Rfl: 11   spironolactone (ALDACTONE) 50 MG tablet, Take 100 mg by mouth daily., Disp: , Rfl:    traZODone  (DESYREL ) 50 MG tablet, Take 1 tablet (50 mg total) by mouth at bedtime., Disp: 30 tablet, Rfl: 11   tretinoin  (RETIN-A) 0.025 % cream, SMARTSIG:sparingly Topical Every Night, Disp: , Rfl:    VITAMIN D  PO, Take by mouth daily., Disp: , Rfl:    busPIRone  (BUSPAR ) 15 MG tablet, Take 3 (45 mg) tablets by mouth daily at bedtime., Disp: 90 tablet, Rfl: 5   modafinil  (PROVIGIL ) 200 MG tablet, TAKE 1 TABLET(200 MG) BY MOUTH DAILY, Disp: 90 tablet, Rfl: 1   phentermine  37.5 MG capsule, Take 1 capsule (37.5 mg total) by mouth in the morning., Disp: 30 capsule, Rfl: 5  PAST MEDICAL HISTORY: Past Medical History:  Diagnosis Date   Incoordination     PAST SURGICAL HISTORY: Past Surgical History:  Procedure Laterality Date   LASIK Bilateral 2017   REFRACTIVE SURGERY Bilateral     FAMILY HISTORY: Family History  Problem Relation Age of Onset   Thyroid  disease Mother    Diabetes Father    Thyroid  disease Brother  SOCIAL HISTORY:  Social History   Socioeconomic History   Marital status: Single    Spouse name: Not on file   Number of children: 0   Years of education: college   Highest education level: Not on file  Occupational History   Occupation: emergency planning/management officer  Tobacco Use   Smoking status: Never   Smokeless tobacco: Never  Vaping Use   Vaping status: Never Used  Substance and Sexual Activity   Alcohol use: Not Currently    Comment: very little   Drug use: Never   Sexual activity: Not on file  Other Topics Concern   Not on file  Social History Narrative   Lives at home with boyfriend.   Right-handed.   Drinks coffee, with extra espresso, in the mornings.  Occasionally uses caffeinated energy powder. (about 600mg  per day)   She works as a emergency planning/management officer in [place].   Social Drivers of Health   Tobacco Use: Low Risk (02/14/2024)   Patient History    Smoking Tobacco Use: Never    Smokeless Tobacco Use: Never    Passive Exposure: Not on file  Financial Resource Strain: Not on file  Food Insecurity: Not on file  Transportation Needs: Not on file  Physical Activity: Not on  file  Stress: Not on file  Social Connections: Not on file  Intimate Partner Violence: Not on file  Depression (EYV7-0): Not on file  Alcohol Screen: Not on file  Housing: Not on file  Utilities: Not on file  Health Literacy: Not on file     PHYSICAL EXAM  Vitals:   02/14/24 1017  BP: 127/84  Pulse: 78  SpO2: 99%  Weight: 246 lb (111.6 kg)  Height: 5' 6 (1.676 m)     Body mass index is 39.71 kg/m.   General: The patient is well-developed and well-nourished and in no acute distress  HEENT:  Head is [place]/AT.  Sclera are anicteric.    Skin: Extremities are without rash or  edema.  Neurologic Exam  Mental status: The patient is alert and oriented x 3 at the time of the examination. The patient has apparent normal recent and remote memory, with an apparently normal attention span and concentration ability.   Speech is normal.  Cranial nerves: Extraocular movements are full.  Facial symmetry is present. There is good facial sensation to soft touch bilaterally.Facial strength is normal.  Trapezius and sternocleidomastoid strength is normal. No dysarthria is noted.   No obvious hearing deficits are noted.  Motor:  Muscle bulk is normal.   Tone is normal. Strength is  5 / 5 in all 4 extremities.   Sensory: Hand sensation is normal and no Tinel's signs.   Intact touch and vibration sensation.  Coordination: Cerebellar testing reveals good finger-nose-finger and heel-to-shin bilaterally.  Gait and station: Station is normal.  The gait is normal.  Tandem gait is normal..  Romberg is negative.   Reflexes: Deep tendon reflexes are symmetric and normal bilaterally.        DIAGNOSTIC DATA (LABS, IMAGING, TESTING) - I reviewed patient records, labs, notes, testing and imaging myself where available.  Lab Results  Component Value Date   WBC 7.8 04/20/2022   HGB 13.8 04/20/2022   HCT 42.5 04/20/2022   MCV 94 04/20/2022   PLT 306 04/20/2022      Component Value Date/Time    NA 137 04/20/2022 1357   K 4.4 04/20/2022 1357   CL 100 04/20/2022 1357   CO2 23 04/20/2022  1357   GLUCOSE 75 04/20/2022 1357   BUN 15 04/20/2022 1357   CREATININE 0.95 04/20/2022 1357   CALCIUM 9.3 04/20/2022 1357   PROT 7.0 04/20/2022 1357   ALBUMIN 4.6 04/20/2022 1357   ALBUMIN 4.8 03/13/2019 0909   AST 15 04/20/2022 1357   ALT 11 04/20/2022 1357   ALKPHOS 100 04/20/2022 1357   BILITOT 0.3 04/20/2022 1357   GFRNONAA 112 02/12/2020 1432   GFRAA 129 02/12/2020 1432   No results found for: CHOL, HDL, LDLCALC, LDLDIRECT, TRIG, CHOLHDL No results found for: YHAJ8R Lab Results  Component Value Date   VITAMINB12 674 04/20/2022   Lab Results  Component Value Date   TSH 0.467 04/20/2022       ASSESSMENT AND PLAN  Multiple sclerosis, relapsing-remitting  High risk medication use  Anxiety  Migraine without aura and without status migrainosus, not intractable  Insomnia, unspecified type  1.   Her MS has remained stable.  She did the second year Mavenclad January/February 2022.  No further lab work is needed.  We will check MRI in the next few weeks and continue to check an MRI every 12 to 18 months to determine if there is breakthrough activity.  If this is occurring we would need to consider adding a disease modifying therapy.   2.    Continue phentermine .  If the daytime sleepiness worsens we would need to check a home sleep study. 3.    Continue BuSpar  for anxiety 15 mg p.o. twice daily.   4.   I will add trazodone  nightly for her insomnia.  She can increase the dose from 50 mg to 100 mg based on response.   5.   She will return in 6  months or sooner if there are new or worsening neurologic symptoms.   Visit after that can be in 6 months.     Amrit Cress A. Vear, MD, [facility] 02/14/2024, 10:54 AM Certified in Neurology, Clinical Neurophysiology, Sleep Medicine and Neuroimaging  [facility] [address], [address] [place], [place]  [address] [phone]

## 2024-02-23 ENCOUNTER — Encounter: Payer: Self-pay | Admitting: Family Medicine

## 2024-02-27 ENCOUNTER — Other Ambulatory Visit: Payer: Self-pay | Admitting: Neurology

## 2024-02-27 MED ORDER — TRAZODONE HCL 100 MG PO TABS: 100.0000 mg | ORAL_TABLET | Freq: Every day | ORAL | 11 refills | Status: AC
